# Patient Record
Sex: Female | Born: 1980
Health system: Southern US, Community
[De-identification: ages and names within clinical notes are randomized; demographics above are authoritative.]

## PROBLEM LIST (undated history)

## (undated) DIAGNOSIS — Z973 Presence of spectacles and contact lenses: Secondary | ICD-10-CM

## (undated) DIAGNOSIS — R112 Nausea with vomiting, unspecified: Secondary | ICD-10-CM

## (undated) DIAGNOSIS — J189 Pneumonia, unspecified organism: Secondary | ICD-10-CM

## (undated) DIAGNOSIS — E7800 Pure hypercholesterolemia, unspecified: Secondary | ICD-10-CM

## (undated) DIAGNOSIS — Z800 Family history of malignant neoplasm of digestive organs: Secondary | ICD-10-CM

## (undated) DIAGNOSIS — R002 Palpitations: Secondary | ICD-10-CM

## (undated) DIAGNOSIS — Z9889 Other specified postprocedural states: Secondary | ICD-10-CM

## (undated) DIAGNOSIS — T753XXA Motion sickness, initial encounter: Secondary | ICD-10-CM

## (undated) HISTORY — DX: Pure hypercholesterolemia, unspecified: E78.00

## (undated) HISTORY — DX: Family history of malignant neoplasm of digestive organs: Z80.0

---

## 2014-06-25 ENCOUNTER — Encounter (HOSPITAL_BASED_OUTPATIENT_CLINIC_OR_DEPARTMENT_OTHER): Payer: Self-pay | Admitting: *Deleted

## 2014-06-25 ENCOUNTER — Emergency Department (HOSPITAL_BASED_OUTPATIENT_CLINIC_OR_DEPARTMENT_OTHER)
Admission: EM | Admit: 2014-06-25 | Discharge: 2014-06-25 | Disposition: A | Discharge status: Home / Self Care | Payer: Managed Care, Other (non HMO) | Attending: Emergency Medicine | Admitting: Emergency Medicine

## 2014-06-25 DIAGNOSIS — Z23 Encounter for immunization: Secondary | ICD-10-CM | POA: Insufficient documentation

## 2014-06-25 DIAGNOSIS — R002 Palpitations: Secondary | ICD-10-CM | POA: Insufficient documentation

## 2014-06-25 DIAGNOSIS — W57XXXD Bitten or stung by nonvenomous insect and other nonvenomous arthropods, subsequent encounter: Secondary | ICD-10-CM | POA: Diagnosis not present

## 2014-06-25 DIAGNOSIS — L03114 Cellulitis of left upper limb: Secondary | ICD-10-CM | POA: Diagnosis not present

## 2014-06-25 DIAGNOSIS — S40862D Insect bite (nonvenomous) of left upper arm, subsequent encounter: Secondary | ICD-10-CM | POA: Diagnosis present

## 2014-06-25 MED ORDER — CEFAZOLIN SODIUM 1-5 GM-% IV SOLN
1.0000 g | Freq: Once | INTRAVENOUS | Status: AC
  Administered 2014-06-25: 1 g via INTRAVENOUS
  Filled 2014-06-25: qty 50

## 2014-06-25 MED ORDER — CEPHALEXIN 500 MG PO CAPS: 500.0000 mg | ORAL_CAPSULE | Freq: Four times a day (QID) | ORAL | Status: DC

## 2014-06-25 MED ORDER — TETANUS-DIPHTH-ACELL PERTUSSIS 5-2.5-18.5 LF-MCG/0.5 IM SUSP
0.5000 mL | Freq: Once | INTRAMUSCULAR | Status: AC
  Administered 2014-06-25: 0.5 mL via INTRAMUSCULAR
  Filled 2014-06-25: qty 0.5

## 2014-06-25 NOTE — ED Provider Notes (Signed)
CSN: [id number]     Arrival date & time 06/25/14  1937 History  This chart was scribed for Orlie Dakin, MD by Peyton Bottoms, ED Scribe. This patient was seen in room MHT13/MHT13 and the patient's care was started at 9:03 PM.   Chief Complaint  Patient presents with  . Insect Bite   Patient is a 34 y.o. female presenting with rash. The history is provided by the patient. No language interpreter was used.  Rash Location:  Shoulder/arm Shoulder/arm rash location:  L arm and L upper arm Quality: itchiness and redness   Severity:  Moderate Onset quality:  Gradual Duration:  10 hours Timing:  Constant Progression:  Spreading Chronicity:  New Context: insect bite/sting   Relieved by:  Nothing Ineffective treatments: doxycycline, benadryl, ibuprofen.  HPI Comments: Rhonda Shaw is a 34 y.o. female who presents to the Emergency Department complaining of insect bites to left upper arm that occurred yesterday. Patient suspects a spider bite but is not sure. She states that she felt something bite her, but is unsure what it was. She states that she was seen by Urgent Care earlier today and was given 1 dose of doxycycline, benadryl and ibuprofen. Patient reports increased spreading of rash past the outline made by urgent care earlier today. She reports associated redness and throbbing to upper left arm..  . Sh Patient is unsure of last tetanus shot. Patient does not smoke, drink alcohol or use recreational drugs. Patient was driven by family member to ED today. Denies shortness of breath denies fever or complains of mild general lays.  History reviewed. No pertinent past medical history. History reviewed. No pertinent past surgical history. past medical history negative History reviewed. No pertinent family history. History  Substance Use Topics  . Smoking status: Never Smoker   . Smokeless tobacco: Not on file  . Alcohol Use: No   OB History    No data available     Review of Systems   Constitutional: Negative.        Mild generalized malaise  HENT: Negative.   Respiratory: Negative.   Cardiovascular: Positive for palpitations.       Palpitations lasting a few seconds earlier today, resolved  Gastrointestinal: Negative.   Musculoskeletal: Negative.   Skin: Positive for rash.  Neurological: Negative.   Psychiatric/Behavioral: Negative.   All other systems reviewed and are negative.  Allergies  Sulfa antibiotics  Home Medications   Prior to Admission medications   Not on File   Triage Vitals: BP 160/90 mmHg  Pulse 89  Temp(Src) 97.8 F (36.6 C) (Oral)  Resp 16  Ht 4' 11.5" (1.511 m)  Wt 141 lb (63.957 kg)  BMI 28.01 kg/m2  SpO2 100%  LMP 06/11/2014  Physical Exam  Constitutional: She appears well-developed and well-nourished.  HENT:  Head: Normocephalic and atraumatic.  Eyes: Conjunctivae are normal. Pupils are equal, round, and reactive to light.  Neck: Neck supple. No tracheal deviation present. No thyromegaly present.  Cardiovascular: Normal rate.   Pulmonary/Chest: Effort normal. No respiratory distress.  Abdominal: Soft. Bowel sounds are normal. She exhibits no distension. There is no tenderness.  Musculoskeletal: Normal range of motion. She exhibits no edema or tenderness.  Neurological: She is alert. Coordination normal.  Skin: Skin is warm and dry. Rash noted.  3 dime-sized papular lesions to left upper medial arm with surrounding erythema, and a single dime-sized papular lesion at distal volar forearm with surrounding erythema. There is mildly tender. No fluctuance. Radial pulse 2+. All  digits with full range of motion good capillary refill and extremity has full range of motion. No axillary nodes.  Psychiatric: She has a normal mood and affect.  Nursing note and vitals reviewed.  ED Course  Procedures (including critical care time)  DIAGNOSTIC STUDIES: Oxygen Saturation is 100% on RA, normal by my interpretation.    COORDINATION OF  CARE: 9:11 PM- Discussed plans to give patient IV fluids and Ancef. Pt advised of plan for treatment and pt agrees.  Labs Review Labs Reviewed - No data to display  Imaging Review No results found.   EKG Interpretation None     10:20 PM resting comfortably after treatment with infusion of intravenous Ancef. MDM  Plan prescription Keflex. Continue doxycycline. Return for recheck or see PMD in 2 or 3 days or urgent care center. Sooner if redness continues to spread. Diagnosis Final diagnoses:  None   Dx: cellulitis  ofleft arm   I personally performed the services described in this documentation, which was scribed in my presence. The recorded information has been reviewed and is accurate.  Orlie Dakin, MD 06/25/14 2227

## 2014-06-25 NOTE — ED Notes (Signed)
Pt c/o insect bite to left upper arm arm, seen by UC given ABX , here tonight for increased pain/swelling and redness

## 2014-06-25 NOTE — Discharge Instructions (Signed)
Cellulitis  Take the medicine prescribed tonight in addition to the doxycycline that you were prescribed earlier.you can get the prescription filled and start taking the new antibiotic tomorrow. Take Tylenol or Advil as directed for pain. Return in 2 or 3 days or see your doctor or an urgent care center to get your arm rechecked. Return sooner if redness continues to spread, if you develop fever, vomiting, or feel worse for any reason. Cellulitis is an infection of the skin and the tissue beneath it. The infected area is usually red and tender. Cellulitis occurs most often in the arms and lower legs.  CAUSES  Cellulitis is caused by bacteria that enter the skin through cracks or cuts in the skin. The most common types of bacteria that cause cellulitis are staphylococci and streptococci. SIGNS AND SYMPTOMS   Redness and warmth.  Swelling.  Tenderness or pain.  Fever. DIAGNOSIS  Your health care provider can usually determine what is wrong based on a physical exam. Blood tests may also be done. TREATMENT  Treatment usually involves taking an antibiotic medicine. HOME CARE INSTRUCTIONS   Take your antibiotic medicine as directed by your health care provider. Finish the antibiotic even if you start to feel better.  Keep the infected arm or leg elevated to reduce swelling.  Apply a warm cloth to the affected area up to 4 times per day to relieve pain.  Take medicines only as directed by your health care provider.  Keep all follow-up visits as directed by your health care provider. SEEK MEDICAL CARE IF:   You notice red streaks coming from the infected area.  Your red area gets larger or turns dark in color.  Your bone or joint underneath the infected area becomes painful after the skin has healed.  Your infection returns in the same area or another area.  You notice a swollen bump in the infected area.  You develop new symptoms.  You have a fever. SEEK IMMEDIATE MEDICAL CARE  IF:   You feel very sleepy.  You develop vomiting or diarrhea.  You have a general ill feeling (malaise) with muscle aches and pains. MAKE SURE YOU:   Understand these instructions.  Will watch your condition.  Will get help right away if you are not doing well or get worse. Document Released: 03/01/2005 Document Revised: 10/06/2013 Document Reviewed: 08/07/2011 [facility] Patient Information 2015 [place], [place]. This information is not intended to replace advice given to you by your health care provider. Make sure you discuss any questions you have with your health care provider.

## 2018-02-19 DIAGNOSIS — H5213 Myopia, bilateral: Secondary | ICD-10-CM | POA: Diagnosis not present

## 2018-02-19 DIAGNOSIS — Z0100 Encounter for examination of eyes and vision without abnormal findings: Secondary | ICD-10-CM | POA: Diagnosis not present

## 2018-03-01 ENCOUNTER — Ambulatory Visit: Payer: Self-pay | Admitting: Family Medicine

## 2019-04-10 DIAGNOSIS — Z20828 Contact with and (suspected) exposure to other viral communicable diseases: Secondary | ICD-10-CM | POA: Diagnosis not present

## 2019-11-04 HISTORY — PX: APPENDECTOMY: SHX54

## 2019-11-10 DIAGNOSIS — K353 Acute appendicitis with localized peritonitis, without perforation or gangrene: Secondary | ICD-10-CM | POA: Diagnosis not present

## 2019-11-10 DIAGNOSIS — Z20822 Contact with and (suspected) exposure to covid-19: Secondary | ICD-10-CM | POA: Diagnosis not present

## 2019-11-10 DIAGNOSIS — K910 Vomiting following gastrointestinal surgery: Secondary | ICD-10-CM | POA: Diagnosis not present

## 2019-11-11 DIAGNOSIS — R110 Nausea: Secondary | ICD-10-CM | POA: Diagnosis not present

## 2019-11-11 DIAGNOSIS — Z9889 Other specified postprocedural states: Secondary | ICD-10-CM | POA: Insufficient documentation

## 2019-11-11 DIAGNOSIS — Z20822 Contact with and (suspected) exposure to covid-19: Secondary | ICD-10-CM | POA: Diagnosis not present

## 2019-11-11 DIAGNOSIS — K353 Acute appendicitis with localized peritonitis, without perforation or gangrene: Secondary | ICD-10-CM | POA: Diagnosis not present

## 2019-11-11 DIAGNOSIS — R935 Abnormal findings on diagnostic imaging of other abdominal regions, including retroperitoneum: Secondary | ICD-10-CM | POA: Diagnosis not present

## 2019-11-11 DIAGNOSIS — R112 Nausea with vomiting, unspecified: Secondary | ICD-10-CM | POA: Insufficient documentation

## 2019-11-11 DIAGNOSIS — R1031 Right lower quadrant pain: Secondary | ICD-10-CM | POA: Diagnosis not present

## 2019-11-11 DIAGNOSIS — K910 Vomiting following gastrointestinal surgery: Secondary | ICD-10-CM | POA: Diagnosis not present

## 2019-11-11 DIAGNOSIS — K37 Unspecified appendicitis: Secondary | ICD-10-CM | POA: Insufficient documentation

## 2019-11-13 DIAGNOSIS — K3589 Other acute appendicitis without perforation or gangrene: Secondary | ICD-10-CM | POA: Diagnosis not present

## 2019-12-11 DIAGNOSIS — K3589 Other acute appendicitis without perforation or gangrene: Secondary | ICD-10-CM | POA: Diagnosis not present

## 2020-02-04 DIAGNOSIS — Z03818 Encounter for observation for suspected exposure to other biological agents ruled out: Secondary | ICD-10-CM | POA: Diagnosis not present

## 2020-02-16 ENCOUNTER — Ambulatory Visit: Payer: Self-pay | Admitting: Obstetrics and Gynecology

## 2020-03-22 NOTE — Progress Notes (Signed)
PCP:  Patient, No Pcp Per   Chief Complaint  Patient presents with  . Gynecologic Exam  . Injections    flu     HPI:      [name] is a 39 y.o. No obstetric history on file. whose LMP was Patient's last menstrual period was 03/04/2020 (exact date)., presents today for her NP > 3 yrs annual examination.  Her menses are regular every 28-30 days, lasting 4-6 days.  Dysmenorrhea mild, occurring first 1-2 days of flow. She does not have intermenstrual bleeding.  Sex activity: single partner, contraception - none. Last Pap: 04/28/14  Results were: no abnormalities /neg HPV DNA  Hx of STDs: none  Last mammogram: never There is a FH of breast cancer in her pat grt aunt and aunt. Her pat aunt also had colon cancer and was recently found to have Lynch syndrome. Pt's sister also has Lynch syndrome. Pt hasn't been tested yet. Unsure which mutation sister has. There is no FH of ovarian cancer. The patient does do self-breast exams.  Tobacco use: The patient denies current or previous tobacco use. Alcohol use: social drinker No drug use.  Exercise: not active  She does get adequate calcium and Vitamin D in her diet. No recent labs. Hx of high cholesterol, on meds in past.   Past Medical History:  Diagnosis Date  . Family history of Lynch syndrome   . Hypercholesterolemia     History reviewed. No pertinent surgical history.  Family History  Problem Relation Age of Onset  . Breast cancer Paternal Aunt 65       triple neg; Lynch syndrome  . Colon cancer Paternal Aunt 49  . Prostate cancer Paternal Great-grandfather        72s  . Other Sister 76       Lynch Syndrome Positive  . Breast cancer Paternal Aunt        not sure of age  . Prostate cancer Paternal Grandfather 70    Social History   Socioeconomic History  . Marital status: Married    Spouse name: Not on file  . Number of children: Not on file  . Years of education: Not on file  . Highest education level:  Not on file  Occupational History  . Not on file  Tobacco Use  . Smoking status: Never Smoker  . Smokeless tobacco: Never Used  Vaping Use  . Vaping Use: Never used  Substance and Sexual Activity  . Alcohol use: No  . Drug use: No  . Sexual activity: Yes    Birth control/protection: None  Other Topics Concern  . Not on file  Social History Narrative  . Not on file   Social Determinants of Health   Financial Resource Strain:   . Difficulty of Paying Living Expenses: Not on file  Food Insecurity:   . Worried About Charity fundraiser in the Last Year: Not on file  . Ran Out of Food in the Last Year: Not on file  Transportation Needs:   . Lack of Transportation (Medical): Not on file  . Lack of Transportation (Non-Medical): Not on file  Physical Activity:   . Days of Exercise per Week: Not on file  . Minutes of Exercise per Session: Not on file  Stress:   . Feeling of Stress : Not on file  Social Connections:   . Frequency of Communication with Friends and Family: Not on file  . Frequency of Social Gatherings with Friends and  Family: Not on file  . Attends Religious Services: Not on file  . Active Member of Clubs or Organizations: Not on file  . Attends Archivist Meetings: Not on file  . Marital Status: Not on file  Intimate Partner Violence:   . Fear of Current or Ex-Partner: Not on file  . Emotionally Abused: Not on file  . Physically Abused: Not on file  . Sexually Abused: Not on file    No current outpatient medications on file.     ROS:  Review of Systems  Constitutional: Negative for fatigue, fever and unexpected weight change.  Respiratory: Negative for cough, shortness of breath and wheezing.   Cardiovascular: Negative for chest pain, palpitations and leg swelling.  Gastrointestinal: Negative for blood in stool, constipation, diarrhea, nausea and vomiting.  Endocrine: Negative for cold intolerance, heat intolerance and polyuria.   Genitourinary: Negative for dyspareunia, dysuria, flank pain, frequency, genital sores, hematuria, menstrual problem, pelvic pain, urgency, vaginal bleeding, vaginal discharge and vaginal pain.  Musculoskeletal: Negative for back pain, joint swelling and myalgias.  Skin: Negative for rash.  Neurological: Negative for dizziness, syncope, light-headedness, numbness and headaches.  Hematological: Negative for adenopathy.  Psychiatric/Behavioral: Negative for agitation, confusion, sleep disturbance and suicidal ideas. The patient is not nervous/anxious.   BREAST: No symptoms   Objective: BP 120/80   Ht 5' (1.524 m)   Wt 151 lb (68.5 kg)   LMP 03/04/2020 (Exact Date)   BMI 29.49 kg/m    Physical Exam Constitutional:      Appearance: She is well-developed.  Genitourinary:     Vulva, vagina, cervix, uterus, right adnexa and left adnexa normal.     No vulval lesion or tenderness noted.     No vaginal discharge, erythema or tenderness.     No cervical polyp.     Uterus is not enlarged or tender.     No right or left adnexal mass present.     Right adnexa not tender.     Left adnexa not tender.  Neck:     Thyroid: No thyromegaly.  Cardiovascular:     Rate and Rhythm: Normal rate and regular rhythm.     Heart sounds: Normal heart sounds. No murmur heard.   Pulmonary:     Effort: Pulmonary effort is normal.     Breath sounds: Normal breath sounds.  Chest:     Breasts:        Right: No mass, nipple discharge, skin change or tenderness.        Left: No mass, nipple discharge, skin change or tenderness.  Abdominal:     Palpations: Abdomen is soft.     Tenderness: There is no abdominal tenderness. There is no guarding.  Musculoskeletal:        General: Normal range of motion.     Cervical back: Normal range of motion.  Neurological:     General: No focal deficit present.     Mental Status: She is alert and oriented to person, place, and time.     Cranial Nerves: No cranial nerve  deficit.  Skin:    General: Skin is warm and dry.  Psychiatric:        Mood and Affect: Mood normal.        Behavior: Behavior normal.        Thought Content: Thought content normal.        Judgment: Judgment normal.  Vitals reviewed.     Assessment/Plan: Encounter for annual routine gynecological examination  Cervical cancer screening - Plan: Cytology - PAP  Screening for HPV (human papillomavirus) - Plan: Cytology - PAP  Encounter for screening mammogram for malignant neoplasm of breast - Plan: MM 3D SCREEN BREAST BILATERAL; pt to sched mammo  Family history of Lynch syndrome - Plan: Integrated BRACAnalysis; MyRisk testing discussed and done today. Will call with results.   Blood tests for routine general physical examination - Plan: Comprehensive metabolic panel, Lipid panel  Screening cholesterol level - Plan: Lipid panel; hx of hyperlipidemia on meds in past  Need for immunization against influenza - Plan: Flu Vaccine QUAD 36+ mos IM      GYN counsel adequate intake of calcium and vitamin D, diet and exercise     F/U  Return in about 1 year (around 03/23/2021).  Shaiann Mcmanamon B. Keyon Liller, PA-C 03/23/2020 11:57 AM

## 2020-03-22 NOTE — Patient Instructions (Addendum)
I value your feedback and entrusting us with your care. If you get a [facility] patient survey, I would appreciate you taking the time to let us know about your experience today. Thank you!  As of May 15, 2019, your lab results will be released to your MyChart immediately, before I even have a chance to see them. Please give me time to review them and contact you if there are any abnormalities. Thank you for your patience.   [facility]: [phone]  [facility]: [phone]  

## 2020-03-23 ENCOUNTER — Encounter: Payer: Self-pay | Admitting: Obstetrics and Gynecology

## 2020-03-23 ENCOUNTER — Other Ambulatory Visit (HOSPITAL_COMMUNITY)
Admission: RE | Admit: 2020-03-23 | Discharge: 2020-03-23 | Disposition: A | Discharge status: Home / Self Care | Payer: No Typology Code available for payment source | Source: Ambulatory Visit | Attending: Obstetrics and Gynecology | Admitting: Obstetrics and Gynecology

## 2020-03-23 ENCOUNTER — Other Ambulatory Visit: Payer: Self-pay

## 2020-03-23 ENCOUNTER — Ambulatory Visit (INDEPENDENT_AMBULATORY_CARE_PROVIDER_SITE_OTHER): Payer: No Typology Code available for payment source | Admitting: Obstetrics and Gynecology

## 2020-03-23 VITALS — BP 120/80 | Ht 60.0 in | Wt 151.0 lb

## 2020-03-23 DIAGNOSIS — Z1231 Encounter for screening mammogram for malignant neoplasm of breast: Secondary | ICD-10-CM

## 2020-03-23 DIAGNOSIS — Z124 Encounter for screening for malignant neoplasm of cervix: Secondary | ICD-10-CM | POA: Insufficient documentation

## 2020-03-23 DIAGNOSIS — Z13220 Encounter for screening for lipoid disorders: Secondary | ICD-10-CM

## 2020-03-23 DIAGNOSIS — Z23 Encounter for immunization: Secondary | ICD-10-CM | POA: Diagnosis not present

## 2020-03-23 DIAGNOSIS — Z800 Family history of malignant neoplasm of digestive organs: Secondary | ICD-10-CM

## 2020-03-23 DIAGNOSIS — Z1151 Encounter for screening for human papillomavirus (HPV): Secondary | ICD-10-CM

## 2020-03-23 DIAGNOSIS — Z01419 Encounter for gynecological examination (general) (routine) without abnormal findings: Secondary | ICD-10-CM

## 2020-03-23 DIAGNOSIS — Z0000 Encounter for general adult medical examination without abnormal findings: Secondary | ICD-10-CM | POA: Diagnosis not present

## 2020-03-23 DIAGNOSIS — R69 Illness, unspecified: Secondary | ICD-10-CM | POA: Diagnosis not present

## 2020-03-24 LAB — COMPREHENSIVE METABOLIC PANEL
ALT: 18 IU/L (ref 0–32)
AST: 21 IU/L (ref 0–40)
Albumin/Globulin Ratio: 1.7 (ref 1.2–2.2)
Albumin: 4.8 g/dL (ref 3.8–4.8)
Alkaline Phosphatase: 84 IU/L (ref 44–121)
BUN/Creatinine Ratio: 8 — ABNORMAL LOW (ref 9–23)
BUN: 6 mg/dL (ref 6–24)
Bilirubin Total: <0.2 mg/dL (ref 0.0–1.2)
CO2: 23 mmol/L (ref 20–29)
Calcium: 9.6 mg/dL (ref 8.7–10.2)
Chloride: 104 mmol/L (ref 96–106)
Creatinine, Ser: 0.72 mg/dL (ref 0.57–1.00)
GFR calc Af Amer: 121 mL/min/{1.73_m2} (ref >59)
GFR calc non Af Amer: 105 mL/min/{1.73_m2} (ref >59)
Globulin, Total: 2.8 g/dL (ref 1.5–4.5)
Glucose: 83 mg/dL (ref 65–99)
Potassium: 4.3 mmol/L (ref 3.5–5.2)
Sodium: 141 mmol/L (ref 134–144)
Total Protein: 7.6 g/dL (ref 6.0–8.5)

## 2020-03-24 LAB — LIPID PANEL
Chol/HDL Ratio: 4.9 ratio — ABNORMAL HIGH (ref 0.0–4.4)
Cholesterol, Total: 241 mg/dL — ABNORMAL HIGH (ref 100–199)
HDL: 49 mg/dL (ref >39)
LDL Chol Calc (NIH): 164 mg/dL — ABNORMAL HIGH (ref 0–99)
Triglycerides: 155 mg/dL — ABNORMAL HIGH (ref 0–149)
VLDL Cholesterol Cal: 28 mg/dL (ref 5–40)

## 2020-03-25 ENCOUNTER — Encounter: Payer: Self-pay | Admitting: Obstetrics and Gynecology

## 2020-03-26 LAB — CYTOLOGY - PAP
Comment: NEGATIVE
Diagnosis: NEGATIVE
High risk HPV: NEGATIVE

## 2020-04-05 DIAGNOSIS — Z1509 Genetic susceptibility to other malignant neoplasm: Secondary | ICD-10-CM

## 2020-04-05 DIAGNOSIS — Z803 Family history of malignant neoplasm of breast: Secondary | ICD-10-CM

## 2020-04-05 HISTORY — DX: Genetic susceptibility to other malignant neoplasm: Z15.09

## 2020-04-05 HISTORY — DX: Family history of malignant neoplasm of breast: Z80.3

## 2020-04-20 ENCOUNTER — Encounter: Payer: Self-pay | Admitting: Obstetrics and Gynecology

## 2020-05-06 DIAGNOSIS — Z03818 Encounter for observation for suspected exposure to other biological agents ruled out: Secondary | ICD-10-CM | POA: Diagnosis not present

## 2020-05-06 DIAGNOSIS — U071 COVID-19: Secondary | ICD-10-CM

## 2020-05-06 DIAGNOSIS — Z20822 Contact with and (suspected) exposure to covid-19: Secondary | ICD-10-CM | POA: Diagnosis not present

## 2020-05-06 HISTORY — DX: COVID-19: U07.1

## 2020-05-11 ENCOUNTER — Telehealth: Payer: Self-pay | Admitting: Obstetrics and Gynecology

## 2020-05-11 DIAGNOSIS — Z1509 Genetic susceptibility to other malignant neoplasm: Secondary | ICD-10-CM

## 2020-05-11 DIAGNOSIS — Z1211 Encounter for screening for malignant neoplasm of colon: Secondary | ICD-10-CM

## 2020-05-11 NOTE — Telephone Encounter (Signed)
Pt aware of positive PMS2 mutation/Lynch syndrome. Sister has same mutation.   Due to increased risk of   Colon/small bowel and gastric cancer--ref to GI for EGD/colonoscopy. Ovarian cancer--recommended BSO vs Gyn u/s and ca-125. Done childbearing. Uterine cancer--recommended hyst vs EMB and GYN u/s.  No FH pancreatic cancer.  Pt to discuss options with husband and f/u with me for either GYN u/s, ca-125 and EMB or with MD for hyst/BSO consultation.   Pt with FH breast cancer, IBIS=18.2%/riskscore=16.5%. No addl screening indicated. Recommended monthly SBE and yearly mammo. Order placed, pt to sched.  Patient understands these results only apply to her and her children, and this is not indicative of genetic testing results of her other family members. It is recommended that her other family members have genetic testing done.  Hard copy mailed to pt. F/u prn.

## 2020-05-20 ENCOUNTER — Encounter: Payer: Self-pay | Admitting: *Deleted

## 2020-08-05 ENCOUNTER — Encounter: Payer: Self-pay | Admitting: Obstetrics and Gynecology

## 2020-08-11 ENCOUNTER — Encounter: Payer: Self-pay | Admitting: Gastroenterology

## 2020-08-11 ENCOUNTER — Other Ambulatory Visit: Payer: Self-pay

## 2020-08-11 ENCOUNTER — Telehealth (INDEPENDENT_AMBULATORY_CARE_PROVIDER_SITE_OTHER): Payer: Self-pay | Admitting: Gastroenterology

## 2020-08-11 DIAGNOSIS — Z1509 Genetic susceptibility to other malignant neoplasm: Secondary | ICD-10-CM

## 2020-08-11 DIAGNOSIS — Z1211 Encounter for screening for malignant neoplasm of colon: Secondary | ICD-10-CM

## 2020-08-11 MED ORDER — PEG 3350-KCL-NA BICARB-NACL 420 G PO SOLR: 4000.0000 mL | Freq: Once | ORAL | 0 refills | Status: AC

## 2020-08-11 NOTE — Progress Notes (Signed)
Gastroenterology Pre-Procedure Review  Request Date: Monday 08/16/20 Requesting Physician: Dr. Allen Norris  PATIENT REVIEW QUESTIONS: The patient responded to the following health history questions as indicated:    1. Are you having any GI issues? no 2. Do you have a personal history of Polyps? no 3. Do you have a family history of Colon Cancer or Polyps? No family history of colon cancer however family history of Copywriter, advertising and patient 4. Diabetes Mellitus? no 5. Joint replacements in the past 12 months?no 6. Major health problems in the past 3 months?no Appendectomy 11/2019 7. Any artificial heart valves, MVP, or defibrillator?no    MEDICATIONS & ALLERGIES:    Patient reports the following regarding taking any anticoagulation/antiplatelet therapy:   Plavix, Coumadin, Eliquis, Xarelto, Lovenox, Pradaxa, Brilinta, or Effient? no Aspirin? no  Patient confirms/reports the following medications:  Current Outpatient Medications  Medication Sig Dispense Refill  . polyethylene glycol-electrolytes (NULYTELY) 420 g solution Take 4,000 mLs by mouth once for 1 dose. Fill container to the fill line with clear liquid.  Mix well.  Drink 8 oz every 30 minutes until entire contents have been completed.  Do not eat or drink anything 4 hours prior to procedure. 4000 mL 0  . Multiple Vitamin (MULTIVITAMINS PO) See admin instructions.     No current facility-administered medications for this visit.    Patient confirms/reports the following allergies:  Allergies  Allergen Reactions  . Sulfa Antibiotics     Orders Placed This Encounter  Procedures  . Procedural/ Surgical Case Request: COLONOSCOPY WITH PROPOFOL    Standing Status:   Standing    Number of Occurrences:   1    Order Specific Question:   Pre-op diagnosis    Answer:   lynch sydrome, screening colonoscopy    Order Specific Question:   CPT Code    Answer:   17001    AUTHORIZATION INFORMATION Primary Insurance: 1D#: Group  #:  Secondary Insurance: 1D#: Group #:  SCHEDULE INFORMATION: Date: 08/16/20 Time: Location:[facility]

## 2020-08-12 ENCOUNTER — Other Ambulatory Visit: Payer: Self-pay

## 2020-08-12 ENCOUNTER — Ambulatory Visit
Admission: RE | Admit: 2020-08-12 | Discharge: 2020-08-12 | Disposition: A | Discharge status: Home / Self Care | Payer: No Typology Code available for payment source | Source: Ambulatory Visit | Attending: Obstetrics and Gynecology | Admitting: Obstetrics and Gynecology

## 2020-08-12 ENCOUNTER — Other Ambulatory Visit
Admission: RE | Admit: 2020-08-12 | Discharge: 2020-08-12 | Disposition: A | Discharge status: Home / Self Care | Payer: No Typology Code available for payment source | Source: Ambulatory Visit | Attending: Gastroenterology | Admitting: Gastroenterology

## 2020-08-12 DIAGNOSIS — Z01812 Encounter for preprocedural laboratory examination: Secondary | ICD-10-CM | POA: Insufficient documentation

## 2020-08-12 DIAGNOSIS — Z20822 Contact with and (suspected) exposure to covid-19: Secondary | ICD-10-CM | POA: Insufficient documentation

## 2020-08-12 DIAGNOSIS — Z1231 Encounter for screening mammogram for malignant neoplasm of breast: Secondary | ICD-10-CM | POA: Insufficient documentation

## 2020-08-12 LAB — SARS CORONAVIRUS 2 (TAT 6-24 HRS): SARS Coronavirus 2: NEGATIVE

## 2020-08-12 NOTE — Progress Notes (Signed)
I connected with Rhonda Shaw on 08/13/20 at  8:30 AM EST by telephone and verified that I am speaking with the correct person using two identifiers.   I discussed the limitations, risks, security and privacy concerns of performing an evaluation and management service by telephone and the availability of in person appointments. I also discussed with the patient that there may be a patient responsible charge related to this service. The patient expressed understanding and agreed to proceed.  The patient was at home I spoke with the patient from my workstation phone The names of people involved in this encounter were: Rhonda Shaw , and Rhonda Shaw   Gynecology Visit Lynch Syndrome   Chief Complaint: No chief complaint on file.   History of Present Illness:    Paitient is a 40 y.o. 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  She presents today to discuss management options from GYN standpoint.    LMP: Patient's last menstrual period was 07/28/2020 (approximate). Regular monthly, 5 days, light then gets two heavier days.    Review of Systems: Review of Systems  Constitutional: Negative.   Gastrointestinal: Negative.   Genitourinary: Negative.     Past Medical History:  Patient Active Problem List   Diagnosis Date Noted  . PMS2-related Lynch syndrome (HNPCC4) 05/11/2020  . Family history of Lynch syndrome 03/23/2020  . Appendicitis 11/11/2019  . PONV (postoperative nausea and vomiting) 11/11/2019    Past Surgical History:  Past Surgical History:  Procedure Laterality Date  . APPENDECTOMY  11/2019    Obstetric History: [id number]  Family History:  Family History  Problem Relation Age of Onset  . Breast cancer Paternal Aunt 8       triple neg; Lynch syndrome  . Colon cancer Paternal Aunt 30  . Prostate cancer Paternal Great-grandfather        30s  . Other Sister 75       Lynch Syndrome Positive  .  Breast cancer Paternal Aunt        not sure of age  . Prostate cancer Paternal Grandfather 38    Social History:  Social History   Socioeconomic History  . Marital status: Married    Spouse name: Not on file  . Number of children: Not on file  . Years of education: Not on file  . Highest education level: Not on file  Occupational History  . Not on file  Tobacco Use  . Smoking status: Never Smoker  . Smokeless tobacco: Never Used  Vaping Use  . Vaping Use: Never used  Substance and Sexual Activity  . Alcohol use: No    Comment: rare  . Drug use: No  . Sexual activity: Yes    Birth control/protection: None  Other Topics Concern  . Not on file  Social History Narrative  . Not on file   Social Determinants of Health   Financial Resource Strain: Not on file  Food Insecurity: Not on file  Transportation Needs: Not on file  Physical Activity: Not on file  Stress: Not on file  Social Connections: Not on file  Intimate Partner Violence: Not on file    Allergies:  Allergies  Allergen Reactions  . Sulfa Antibiotics Hives    Medications: Prior to Admission medications   Medication Sig Start Date End Date Taking? Authorizing Provider  Multiple Vitamin (MULTIVITAMINS PO) See admin instructions.    [provider]    Physical Exam Last menstrual period 07/28/2020.  Patient's  last menstrual period was 07/28/2020 (approximate).  No physical exam as this was a remote telephone visit to promote social distancing during the current COVID-19 Pandemic   Assessment: 40 y.o. [id number] with lynch syndrome, PMS2 mutation  Plan: Problem List Items Addressed This Visit      Other   PMS2-related Lynch syndrome (HNPCC4) - Primary      1) PMS2 mutation summary of surveillance prevention strategies NCCN guidliness Lynch Syndrome Ver 1.2021, 10/14/2019  Endometrial cancer Estimate age presentation 49-50, cumulative lifetime risk through age 11 13-26% compared  cumulative lifetime risk general population 3.1% Total hysterectomy has not been shown to decrease endometrial cancer mortality, routine endometrial cancer screening has not been shown of benefit.  Hysterectomy may reduce endometrial cancer incidence and may be considered  Ovarian Cancer Estimated age presenbtation 51-50, cumulative risk lifetime risk through age 31 1.3-3% compared cumulative lifetime risk general population 1.3% Studies have failed to show an appreciable increase in ovarian cancer risk over the baseline population.  Decision for risk reducing BSO individualized.  For women electing for ovarian conservation to screening recommendations     - Endometrial biopsy  - pap smear up to date - No prior ultrasound but CT scan 11/2019 [facility] for acute appendicitis revealed normal uterus and ovaries.  2) Telephone Time:  22:39mn  3)  Return if symptoms worsen or fail to improve.   AMalachy Mood MD, [facility]ardon[facility], [facility] 08/13/2020, 8:51 AM

## 2020-08-12 NOTE — Discharge Instructions (Signed)

## 2020-08-13 ENCOUNTER — Ambulatory Visit (INDEPENDENT_AMBULATORY_CARE_PROVIDER_SITE_OTHER): Payer: No Typology Code available for payment source | Admitting: Obstetrics and Gynecology

## 2020-08-13 DIAGNOSIS — Z1509 Genetic susceptibility to other malignant neoplasm: Secondary | ICD-10-CM

## 2020-08-13 DIAGNOSIS — Z7189 Other specified counseling: Secondary | ICD-10-CM | POA: Diagnosis not present

## 2020-08-16 ENCOUNTER — Other Ambulatory Visit: Payer: Self-pay | Admitting: Obstetrics and Gynecology

## 2020-08-16 ENCOUNTER — Ambulatory Visit: Payer: No Typology Code available for payment source | Admitting: Anesthesiology

## 2020-08-16 ENCOUNTER — Encounter: Payer: Self-pay | Admitting: Gastroenterology

## 2020-08-16 ENCOUNTER — Encounter
Admission: RE | Disposition: A | Discharge status: Home / Self Care | Payer: Self-pay | Source: Home / Self Care | Attending: Gastroenterology

## 2020-08-16 ENCOUNTER — Ambulatory Visit
Admission: RE | Admit: 2020-08-16 | Discharge: 2020-08-16 | Disposition: A | Discharge status: Home / Self Care | Payer: No Typology Code available for payment source | Attending: Gastroenterology | Admitting: Gastroenterology

## 2020-08-16 ENCOUNTER — Other Ambulatory Visit: Payer: Self-pay

## 2020-08-16 DIAGNOSIS — Z800 Family history of malignant neoplasm of digestive organs: Secondary | ICD-10-CM | POA: Diagnosis not present

## 2020-08-16 DIAGNOSIS — Z1589 Genetic susceptibility to other disease: Secondary | ICD-10-CM | POA: Diagnosis not present

## 2020-08-16 DIAGNOSIS — Z8616 Personal history of COVID-19: Secondary | ICD-10-CM | POA: Diagnosis not present

## 2020-08-16 DIAGNOSIS — Z803 Family history of malignant neoplasm of breast: Secondary | ICD-10-CM | POA: Diagnosis not present

## 2020-08-16 DIAGNOSIS — Z8042 Family history of malignant neoplasm of prostate: Secondary | ICD-10-CM | POA: Insufficient documentation

## 2020-08-16 DIAGNOSIS — Z882 Allergy status to sulfonamides status: Secondary | ICD-10-CM | POA: Insufficient documentation

## 2020-08-16 DIAGNOSIS — Z1211 Encounter for screening for malignant neoplasm of colon: Secondary | ICD-10-CM | POA: Diagnosis not present

## 2020-08-16 DIAGNOSIS — Z1509 Genetic susceptibility to other malignant neoplasm: Secondary | ICD-10-CM | POA: Diagnosis not present

## 2020-08-16 DIAGNOSIS — R928 Other abnormal and inconclusive findings on diagnostic imaging of breast: Secondary | ICD-10-CM

## 2020-08-16 DIAGNOSIS — N6310 Unspecified lump in the right breast, unspecified quadrant: Secondary | ICD-10-CM

## 2020-08-16 HISTORY — DX: Nausea with vomiting, unspecified: R11.2

## 2020-08-16 HISTORY — DX: Motion sickness, initial encounter: T75.3XXA

## 2020-08-16 HISTORY — PX: ESOPHAGOGASTRODUODENOSCOPY: SHX5428

## 2020-08-16 HISTORY — DX: Other specified postprocedural states: Z98.890

## 2020-08-16 HISTORY — PX: COLONOSCOPY WITH PROPOFOL: SHX5780

## 2020-08-16 HISTORY — DX: Presence of spectacles and contact lenses: Z97.3

## 2020-08-16 HISTORY — PX: BIOPSY: SHX5522

## 2020-08-16 LAB — POCT PREGNANCY, URINE: Preg Test, Ur: NEGATIVE

## 2020-08-16 SURGERY — COLONOSCOPY WITH PROPOFOL
Anesthesia: General | Site: Throat

## 2020-08-16 MED ORDER — LIDOCAINE HCL (CARDIAC) PF 100 MG/5ML IV SOSY
PREFILLED_SYRINGE | INTRAVENOUS | Status: DC | PRN
  Administered 2020-08-16: 60 mg via INTRAVENOUS

## 2020-08-16 MED ORDER — ACETAMINOPHEN 325 MG PO TABS: 325.0000 mg | ORAL_TABLET | ORAL | Status: DC | PRN

## 2020-08-16 MED ORDER — GLYCOPYRROLATE 0.2 MG/ML IJ SOLN
INTRAMUSCULAR | Status: DC | PRN
  Administered 2020-08-16: .1 mg via INTRAVENOUS

## 2020-08-16 MED ORDER — ACETAMINOPHEN 160 MG/5ML PO SOLN: 325.0000 mg | ORAL | Status: DC | PRN

## 2020-08-16 MED ORDER — LACTATED RINGERS IV SOLN: INTRAVENOUS | Status: DC | PRN

## 2020-08-16 MED ORDER — LACTATED RINGERS IV SOLN: INTRAVENOUS | Status: DC

## 2020-08-16 MED ORDER — STERILE WATER FOR IRRIGATION IR SOLN
Status: DC | PRN
  Administered 2020-08-16: 15 mL
  Administered 2020-08-16: 100 mL

## 2020-08-16 MED ORDER — ONDANSETRON HCL 4 MG/2ML IJ SOLN: 4.0000 mg | Freq: Once | INTRAMUSCULAR | Status: DC | PRN

## 2020-08-16 MED ORDER — PROPOFOL 10 MG/ML IV BOLUS
INTRAVENOUS | Status: DC | PRN
  Administered 2020-08-16 (×2): 30 mg via INTRAVENOUS
  Administered 2020-08-16: 50 mg via INTRAVENOUS

## 2020-08-16 SURGICAL SUPPLY — 22 items
CLIP HMST 235XBRD CATH ROT (MISCELLANEOUS) IMPLANT
CLIP RESOLUTION 360 11X235 (MISCELLANEOUS)
ELECT REM PT RETURN 9FT ADLT (ELECTROSURGICAL)
ELECTRODE REM PT RTRN 9FT ADLT (ELECTROSURGICAL) IMPLANT
FORCEPS BIOP RAD 4 LRG CAP 4 (CUTTING FORCEPS) ×4 IMPLANT
GOWN CVR UNV OPN BCK APRN NK (MISCELLANEOUS) ×6 IMPLANT
GOWN ISOL THUMB LOOP REG UNIV (MISCELLANEOUS) ×8
INJECTOR VARIJECT VIN23 (MISCELLANEOUS) IMPLANT
KIT DEFENDO VALVE AND CONN (KITS) IMPLANT
KIT PRC NS LF DISP ENDO (KITS) ×3 IMPLANT
KIT PROCEDURE OLYMPUS (KITS) ×4
MANIFOLD NEPTUNE II (INSTRUMENTS) ×4 IMPLANT
MARKER SPOT ENDO TATTOO 5ML (MISCELLANEOUS) IMPLANT
PROBE APC STR FIRE (PROBE) IMPLANT
RETRIEVER NET ROTH 2.5X230 LF (MISCELLANEOUS) IMPLANT
SNARE COLD EXACTO (MISCELLANEOUS) IMPLANT
SNARE SHORT THROW 13M SML OVAL (MISCELLANEOUS) IMPLANT
SNARE SNG USE RND 15MM (INSTRUMENTS) IMPLANT
SPOT EX ENDOSCOPIC TATTOO (MISCELLANEOUS)
TRAP ETRAP POLY (MISCELLANEOUS) IMPLANT
VARIJECT INJECTOR VIN23 (MISCELLANEOUS)
WATER STERILE IRR 250ML POUR (IV SOLUTION) ×4 IMPLANT

## 2020-08-16 NOTE — Anesthesia Procedure Notes (Signed)
Date/Time: 08/16/2020 10:58 AM Performed by: Dionne Bucy, CRNA Pre-anesthesia Checklist: Patient identified, Emergency Drugs available, Suction available, Patient being monitored and Timeout performed Oxygen Delivery Method: Nasal cannula Placement Confirmation: positive ETCO2

## 2020-08-16 NOTE — Op Note (Signed)
[facility] Gastroenterology Patient Name: Rhonda Shaw Procedure Date: 08/16/2020 10:47 AM MRN: [id number] Account #: [id number] Date of Birth: [birth year] Admit Type: Outpatient Age: 40 Room: [facility] OR ROOM 1 Gender: Female Note Status: Finalized Procedure:             Upper GI endoscopy Indications:           Surveillance for malignancy secondary to Lynch Syndrome Providers:             Lucilla Lame MD, MD Referring MD:          [facility] (Referring MD) Medicines:             Propofol per Anesthesia Complications:         No immediate complications. Procedure:             Pre-Anesthesia Assessment:                        - Prior to the procedure, a History and Physical was                         performed, and patient medications and allergies were                         reviewed. The patient's tolerance of previous                         anesthesia was also reviewed. The risks and benefits                         of the procedure and the sedation options and risks                         were discussed with the patient. All questions were                         answered, and informed consent was obtained. Prior                         Anticoagulants: The patient has taken no previous                         anticoagulant or antiplatelet agents. ASA Grade                         Assessment: II - A patient with mild systemic disease.                         After reviewing the risks and benefits, the patient                         was deemed in satisfactory condition to undergo the                         procedure.                        After obtaining informed consent, the endoscope was  passed under direct vision. Throughout the procedure,                         the patient's blood pressure, pulse, and oxygen                         saturations were monitored continuously. The                         Endosonoscope  was introduced through the mouth, and                         advanced to the second part of duodenum. The upper GI                         endoscopy was accomplished without difficulty. The                         patient tolerated the procedure well. Findings:      The examined esophagus was normal.      The entire examined stomach was normal. Biopsies were taken with a cold       forceps for Helicobacter pylori testing.      The examined duodenum was normal. Impression:            - Normal esophagus.                        - Normal stomach. Biopsied.                        - Normal examined duodenum. Recommendation:        - Discharge patient to home.                        - Resume previous diet.                        - Perform a colonoscopy today.                        - Repeat upper endoscopy in 2 years for surveillance. Procedure Code(s):     --- Professional ---                        [phone], Esophagogastroduodenoscopy, flexible,                         transoral; with biopsy, single or multiple Diagnosis Code(s):     --- Professional ---                        Z15.09, Genetic susceptibility to other malignant                         neoplasm CPT copyright 2019 American Medical Association. All rights reserved. The codes documented in this report are preliminary and upon coder review may  be revised to meet current compliance requirements. Lucilla Lame MD, MD 08/16/2020 11:05:42 AM This report has been signed electronically. Number of Addenda: 0 Note Initiated On: 08/16/2020 10:47 AM Total Procedure Duration: 0 hours 3 minutes 1 second  Estimated Blood Loss:  Estimated blood loss: none.      [facility]

## 2020-08-16 NOTE — Anesthesia Preprocedure Evaluation (Signed)
Anesthesia Evaluation  Patient identified by MRN, date of birth, ID band Patient awake    Reviewed: Allergy & Precautions, NPO status   History of Anesthesia Complications (+) PONV  Airway Mallampati: II  TM Distance: >3 FB     Dental   Pulmonary    breath sounds clear to auscultation       Cardiovascular  Rhythm:Regular Rate:Normal  HLD   Neuro/Psych    GI/Hepatic Family history of Lynch syndrome   Endo/Other    Renal/GU      Musculoskeletal   Abdominal   Peds  Hematology   Anesthesia Other Findings   Reproductive/Obstetrics                            Anesthesia Physical Anesthesia Plan  ASA: II  Anesthesia Plan: General   Post-op Pain Management:    Induction: Intravenous  PONV Risk Score and Plan: Propofol infusion, TIVA and Treatment may vary due to age or medical condition  Airway Management Planned: Natural Airway and Nasal Cannula  Additional Equipment:   Intra-op Plan:   Post-operative Plan:   Informed Consent: I have reviewed the patients History and Physical, chart, labs and discussed the procedure including the risks, benefits and alternatives for the proposed anesthesia with the patient or authorized representative who has indicated his/her understanding and acceptance.       Plan Discussed with: CRNA  Anesthesia Plan Comments:         Anesthesia Quick Evaluation

## 2020-08-16 NOTE — Transfer of Care (Signed)
Immediate Anesthesia Transfer of Care Note  Patient: Rhonda Shaw  Procedure(s) Performed: COLONOSCOPY WITH PROPOFOL (N/A Rectum) BIOPSY (N/A Esophagus)  Patient Location: PACU  Anesthesia Type: General  Level of Consciousness: awake, alert  and patient cooperative  Airway and Oxygen Therapy: Patient Spontanous Breathing and Patient connected to supplemental oxygen  Post-op Assessment: Post-op Vital signs reviewed, Patient's Cardiovascular Status Stable, Respiratory Function Stable, Patent Airway and No signs of Nausea or vomiting  Post-op Vital Signs: Reviewed and stable  Complications: No complications documented.

## 2020-08-16 NOTE — H&P (Signed)
Lucilla Lame, MD [facility] [address], [place], [place] [address] [phone] Fax : [phone]  Primary Care Physician:  [facility], [place] Primary Gastroenterologist:  Dr. Allen Norris  Pre-Procedure History & Physical: HPI:  Rhonda Shaw is a 40 y.o. female is here for an endoscopy and colonoscopy.   Past Medical History:  Diagnosis Date  . COVID-19 05/06/2020  . Family history of breast cancer 04/2020   IBIS=18.2%/riskscore=16.5%  . Family history of Lynch syndrome   . Hypercholesterolemia   . Motion sickness    cars  . PMS2-related Lynch syndrome (HNPCC4) 04/2020   [facility] MyRisk  . PONV (postoperative nausea and vomiting)   . Wears contact lenses     Past Surgical History:  Procedure Laterality Date  . APPENDECTOMY  11/2019    Prior to Admission medications   Medication Sig Start Date End Date Taking? Authorizing Provider  Multiple Vitamin (MULTIVITAMINS PO) See admin instructions.   Yes [provider]    Allergies as of 08/11/2020 - Review Complete 08/11/2020  Allergen Reaction Noted  . Sulfa antibiotics Hives 06/25/2014    Family History  Problem Relation Age of Onset  . Breast cancer Paternal Aunt 17       triple neg; Lynch syndrome  . Colon cancer Paternal Aunt 82  . Prostate cancer Paternal Great-grandfather        10s  . Other Sister 76       Lynch Syndrome Positive  . Breast cancer Paternal Aunt        not sure of age  . Prostate cancer Paternal Grandfather 7    Social History   Socioeconomic History  . Marital status: Married    Spouse name: Not on file  . Number of children: Not on file  . Years of education: Not on file  . Highest education level: Not on file  Occupational History  . Not on file  Tobacco Use  . Smoking status: Never Smoker  . Smokeless tobacco: Never Used  Vaping Use  . Vaping Use: Never used  Substance and Sexual Activity  . Alcohol use: No    Comment: rare  . Drug use: No  .  Sexual activity: Yes    Birth control/protection: None  Other Topics Concern  . Not on file  Social History Narrative  . Not on file   Social Determinants of Health   Financial Resource Strain: Not on file  Food Insecurity: Not on file  Transportation Needs: Not on file  Physical Activity: Not on file  Stress: Not on file  Social Connections: Not on file  Intimate Partner Violence: Not on file    Review of Systems: See HPI, otherwise negative ROS  Physical Exam: BP 124/78   Pulse 87   Temp 97.6 F (36.4 C) (Temporal)   Ht 5' (1.524 m)   Wt 68.5 kg   LMP 07/28/2020 (Approximate) Comment: preg test neg  SpO2 100%   BMI 29.49 kg/m  General:   Alert,  pleasant and cooperative in NAD Head:  Normocephalic and atraumatic. Neck:  Supple; no masses or thyromegaly. Lungs:  Clear throughout to auscultation.    Heart:  Regular rate and rhythm. Abdomen:  Soft, nontender and nondistended. Normal bowel sounds, without guarding, and without rebound.   Neurologic:  Alert and  oriented x4;  grossly normal neurologically.  Impression/Plan: Rhonda Shaw is here for an endoscopy and colonoscopy to be performed for lynch syndrome  Risks, benefits, limitations, and alternatives regarding  endoscopy  and colonoscopy have been reviewed with the patient.  Questions have been answered.  All parties agreeable.   Lucilla Lame, MD  08/16/2020, 10:51 AM

## 2020-08-16 NOTE — Op Note (Signed)
[facility] Gastroenterology Patient Name: Rhonda Shaw Procedure Date: 08/16/2020 10:44 AM MRN: [id number] Account #: [id number] Date of Birth: 1980-10-03 Admit Type: Outpatient Age: 40 Room: [facility] OR ROOM 01 Gender: Female Note Status: Finalized Procedure:             Colonoscopy Indications:           Lynch Syndrome Providers:             Lucilla Lame MD, MD Medicines:             Propofol per Anesthesia Complications:         No immediate complications. Procedure:             Pre-Anesthesia Assessment:                        - Prior to the procedure, a History and Physical was                         performed, and patient medications and allergies were                         reviewed. The patient's tolerance of previous                         anesthesia was also reviewed. The risks and benefits                         of the procedure and the sedation options and risks                         were discussed with the patient. All questions were                         answered, and informed consent was obtained. Prior                         Anticoagulants: The patient has taken no previous                         anticoagulant or antiplatelet agents. ASA Grade                         Assessment: II - A patient with mild systemic disease.                         After reviewing the risks and benefits, the patient                         was deemed in satisfactory condition to undergo the                         procedure.                        After obtaining informed consent, the colonoscope was                         passed under direct vision. Throughout the procedure,  the patient's blood pressure, pulse, and oxygen                         saturations were monitored continuously. The was                         introduced through the anus and advanced to the the                         cecum, identified by appendiceal orifice and  ileocecal                         valve. The colonoscopy was performed without                         difficulty. The patient tolerated the procedure well.                         The quality of the bowel preparation was excellent. Findings:      The perianal and digital rectal examinations were normal.      The colon (entire examined portion) appeared normal. Impression:            - The entire examined colon is normal.                        - No specimens collected. Recommendation:        - Discharge patient to home.                        - Resume previous diet.                        - Continue present medications.                        - Repeat colonoscopy in 1 year for surveillance. Procedure Code(s):     --- Professional ---                        [phone], Colonoscopy, flexible; diagnostic, including                         collection of specimen(s) by brushing or washing, when                         performed (separate procedure) Diagnosis Code(s):     --- Professional ---                        Z15.09, Genetic susceptibility to other malignant                         neoplasm CPT copyright 2019 American Medical Association. All rights reserved. The codes documented in this report are preliminary and upon coder review may  be revised to meet current compliance requirements. Lucilla Lame MD, MD 08/16/2020 11:17:41 AM This report has been signed electronically. Number of Addenda: 0 Note Initiated On: 08/16/2020 10:44 AM Scope Withdrawal Time: 0 hours 6 minutes 44 seconds  Total Procedure Duration: 0 hours 8 minutes 18 seconds  Estimated Blood Loss:  Estimated blood loss: none.      [facility]

## 2020-08-16 NOTE — Anesthesia Postprocedure Evaluation (Signed)
Anesthesia Post Note  Patient: Rhonda Shaw  Procedure(s) Performed: COLONOSCOPY WITH PROPOFOL (N/A Rectum) BIOPSY (N/A Esophagus) ESOPHAGOGASTRODUODENOSCOPY (EGD) (N/A Throat)     Patient location during evaluation: PACU Anesthesia Type: General Level of consciousness: awake Pain management: pain level controlled Vital Signs Assessment: post-procedure vital signs reviewed and stable Respiratory status: respiratory function stable Cardiovascular status: stable Postop Assessment: no signs of nausea or vomiting Anesthetic complications: no   No complications documented.  Veda Canning

## 2020-08-18 ENCOUNTER — Encounter: Payer: Self-pay | Admitting: Gastroenterology

## 2020-08-18 LAB — SURGICAL PATHOLOGY

## 2020-08-19 ENCOUNTER — Telehealth: Payer: Self-pay

## 2020-08-19 ENCOUNTER — Telehealth: Payer: Self-pay | Admitting: Obstetrics and Gynecology

## 2020-08-19 ENCOUNTER — Encounter: Payer: Self-pay | Admitting: Gastroenterology

## 2020-08-19 NOTE — Telephone Encounter (Signed)
-----  Message from [name], MD sent at 08/13/2020  8:52 AM EST ----- Regarding: Surgery Surgery Date:   LOS: same day surgery  Surgery Booking Request Patient Full Name: Rhonda Shaw MRN: [id number]  DOB: 06-05-1981  Surgeon: [name], MD  Requested Surgery Date and Time: Patient preference Primary Diagnosis and Code: PMS2 mutation carrier/lynch syndrome Secondary Diagnosis and Code:  Surgical Procedure: TLH, BS, cystoscopy L&D Notification:N/A Admission Status: same day surgery Length of Surgery: 2hrs Special Case Needs: none H&P: 2 weeks prior (date) Phone Interview or Office Pre-Admit: pre-admit Interpreter: No Language: English Medical Clearance: No Special Scheduling Instructions: H&P 2 weeks prior for endometrial biopsy preop

## 2020-08-19 NOTE — Telephone Encounter (Signed)
Pt called with questions about Birads 0 mammo. Has addl imaging sched for 08/23/20. Discussed findings and next steps. Will await addl imaging results. Pt with FH breast cancer but not at increased risk of breast cancer> 20 %.  Pt has Lynch syndrome. Is planning on hyst with AS in a few months.

## 2020-08-19 NOTE — Telephone Encounter (Signed)
Called pt to offer DOS that Rhonda Shaw has available. She is trying to plan her surgery date around vacations and a change in her insurance effective in June. She took the April dates down and would like to discuss this with her husband and call me back to schedule.

## 2020-08-23 ENCOUNTER — Ambulatory Visit
Admission: RE | Admit: 2020-08-23 | Discharge: 2020-08-23 | Disposition: A | Discharge status: Home / Self Care | Payer: No Typology Code available for payment source | Source: Ambulatory Visit | Attending: Obstetrics and Gynecology | Admitting: Obstetrics and Gynecology

## 2020-08-23 ENCOUNTER — Other Ambulatory Visit: Payer: Self-pay

## 2020-08-23 DIAGNOSIS — R922 Inconclusive mammogram: Secondary | ICD-10-CM | POA: Diagnosis not present

## 2020-08-23 DIAGNOSIS — R928 Other abnormal and inconclusive findings on diagnostic imaging of breast: Secondary | ICD-10-CM | POA: Insufficient documentation

## 2020-08-23 DIAGNOSIS — N6310 Unspecified lump in the right breast, unspecified quadrant: Secondary | ICD-10-CM | POA: Insufficient documentation

## 2020-08-23 DIAGNOSIS — N6001 Solitary cyst of right breast: Secondary | ICD-10-CM | POA: Diagnosis not present

## 2020-09-14 ENCOUNTER — Other Ambulatory Visit: Payer: Self-pay

## 2020-09-14 ENCOUNTER — Encounter: Payer: Self-pay | Admitting: Obstetrics and Gynecology

## 2020-09-14 ENCOUNTER — Ambulatory Visit (INDEPENDENT_AMBULATORY_CARE_PROVIDER_SITE_OTHER): Payer: No Typology Code available for payment source | Admitting: Obstetrics and Gynecology

## 2020-09-14 ENCOUNTER — Other Ambulatory Visit (HOSPITAL_COMMUNITY)
Admission: RE | Admit: 2020-09-14 | Discharge: 2020-09-14 | Disposition: A | Discharge status: Home / Self Care | Payer: No Typology Code available for payment source | Source: Ambulatory Visit | Attending: Obstetrics and Gynecology | Admitting: Obstetrics and Gynecology

## 2020-09-14 VITALS — BP 128/74 | Ht 60.0 in | Wt 152.0 lb

## 2020-09-14 DIAGNOSIS — Z1509 Genetic susceptibility to other malignant neoplasm: Secondary | ICD-10-CM

## 2020-09-14 DIAGNOSIS — Z01818 Encounter for other preprocedural examination: Secondary | ICD-10-CM

## 2020-09-14 NOTE — H&P (View-Only) (Signed)
Obstetrics & Gynecology Surgery H&P    Chief Complaint: Scheduled Surgery   History of Present Illness: Patient is a 40 y.o. [id number] presenting for scheduled TLH, BS, cystoscopy, for the treatment or further evaluation of PMS2 associated Lynch Syndrome  Prior Treatments prior to proceeding with surgery include: discussion of current literature and documentation regarding Lynch Syndrome  Preoperative Pap: 03/23/2020 Results: NILM HPV negative  Preoperative Endometrial biopsy: obtained today Preoperative Ultrasound:N/A   Review of Systems:10 point review of systems  Past Medical History:  Patient Active Problem List   Diagnosis Date Noted  . Lynch syndrome   . PMS2-related Lynch syndrome (HNPCC4) 05/11/2020  . Family history of Lynch syndrome 03/23/2020  . Appendicitis 11/11/2019  . PONV (postoperative nausea and vomiting) 11/11/2019    Past Surgical History:  Past Surgical History:  Procedure Laterality Date  . APPENDECTOMY  11/2019  . BIOPSY N/A 08/16/2020   Procedure: BIOPSY;  Surgeon: Lucilla Lame, MD;  Location: [facility];  Service: Endoscopy;  Laterality: N/A;  . COLONOSCOPY WITH PROPOFOL N/A 08/16/2020   Procedure: COLONOSCOPY WITH PROPOFOL;  Surgeon: Lucilla Lame, MD;  Location: [facility];  Service: Endoscopy;  Laterality: N/A;  priority 4  . ESOPHAGOGASTRODUODENOSCOPY N/A 08/16/2020   Procedure: ESOPHAGOGASTRODUODENOSCOPY (EGD);  Surgeon: Lucilla Lame, MD;  Location: [place];  Service: Endoscopy;  Laterality: N/A;    Family History:  Family History  Problem Relation Age of Onset  . Breast cancer Paternal Aunt 45       triple neg; Lynch syndrome  . Colon cancer Paternal Aunt 61  . Prostate cancer Paternal Great-grandfather        60s  . Other Sister 5       Lynch Syndrome Positive  . Breast cancer Paternal Aunt        not sure of age  . Prostate cancer Paternal Grandfather 67    Social History:  Social History    Socioeconomic History  . Marital status: Married    Spouse name: Not on file  . Number of children: Not on file  . Years of education: Not on file  . Highest education level: Not on file  Occupational History  . Not on file  Tobacco Use  . Smoking status: Never Smoker  . Smokeless tobacco: Never Used  Vaping Use  . Vaping Use: Never used  Substance and Sexual Activity  . Alcohol use: No    Comment: rare  . Drug use: No  . Sexual activity: Yes    Birth control/protection: None  Other Topics Concern  . Not on file  Social History Narrative  . Not on file   Social Determinants of Health   Financial Resource Strain: Not on file  Food Insecurity: Not on file  Transportation Needs: Not on file  Physical Activity: Not on file  Stress: Not on file  Social Connections: Not on file  Intimate Partner Violence: Not on file    Allergies:  Allergies  Allergen Reactions  . Sulfa Antibiotics Hives    Medications: Prior to Admission medications   Medication Sig Start Date End Date Taking? Authorizing Provider  Multiple Vitamin (MULTIVITAMINS PO) Take 1 tablet by mouth daily.    [provider]    Physical Exam Vitals: Blood pressure 128/74, height 5' (1.524 m), weight 152 lb (68.9 kg), last menstrual period 08/23/2020. General: NAD HEENT: normocephalic, anicteric Pulmonary: No increased work of breathing, CTAB Cardiovascular: RRR, distal pulses 2+ Abdomen: soft, non-tender, non-distended Genitourinary:  Genitourinary:  External: Normal external female genitalia.  Normal urethral meatus, normal Bartholin's and Skene's glands.    Vagina: Normal vaginal mucosa, no evidence of prolapse.    Cervix: Grossly normal in appearance, no bleeding  Uterus: Non-enlarged, mobile, normal contour.  No CMT   Rectal: deferred Extremities: no edema, erythema, or tenderness Neurologic: Grossly intact Psychiatric: mood appropriate, affect full   ENDOMETRIAL BIOPSY     The  indications for endometrial biopsy were reviewed.   Risks of the biopsy including cramping, bleeding, infection, uterine perforation, inadequate specimen and need for additional procedures  were discussed. The patient states she understands and agrees to undergo procedure today. Consent was signed. Time out was performed. Urine HCG was negative. A Graves speculum was placed and the cervix was brought into view.  The cervix was prepped with Betadine. A single-toothed tenaculum was  placed on the anterior lip of the cervix for traction. A 3 mm pipelle was introduced through the cervix into the endometrial cavity without difficulty to a depth of 8cm, and a small amount of tissue was obtained, the resulting specime sent to pathology. The instruments were removed from the patient's vagina. Minimal bleeding from the cervix was noted. The patient tolerated the procedure well. Routine post-procedure instructions were given to the patient.  She will be contacted by phone one results become available.     Imaging US BREAST LTD UNI RIGHT INC AXILLA  Result Date: 08/23/2020 CLINICAL DATA:  40 year old female presenting as a recall from baseline screening for possible right breast mass. EXAM: DIGITAL DIAGNOSTIC UNILATERAL RIGHT MAMMOGRAM WITH TOMOSYNTHESIS AND CAD; ULTRASOUND RIGHT BREAST LIMITED TECHNIQUE: Right digital diagnostic mammography and breast tomosynthesis was performed. The images were evaluated with computer-aided detection.; Targeted ultrasound examination of the right breast was performed COMPARISON:  Previous exam(s). ACR Breast Density Category c: The breast tissue is heterogeneously dense, which may obscure small masses. FINDINGS: Mammogram: Spot compression tomosynthesis views of the right breast were performed demonstrating persistence of an oval circumscribed mass in the outer right breast posterior depth measuring approximately 5 mm, best seen on the spot cc view. Ultrasound: Targeted ultrasound is  performed in the right breast at 8:30 o'clock 8 cm from the nipple demonstrating an oval circumscribed anechoic bilobed mass measuring 0.8 x 0.4 x 0.5 cm, consistent with a benign simple cyst. No internal vascularity. This corresponds to the mammographic finding. IMPRESSION: Benign simple cyst in the right breast at 8:30 o'clock. No mammographic or sonographic evidence of malignancy. RECOMMENDATION: Screening mammogram in one year.(Code:SM-B-01Y) I have discussed the findings and recommendations with the patient. If applicable, a reminder letter will be sent to the patient regarding the next appointment. BI-RADS CATEGORY  2: Benign. Electronically Signed   By: Audie Pinto M.D.   On: 08/23/2020 15:26   MM DIAG BREAST TOMO UNI RIGHT  Result Date: 08/23/2020 CLINICAL DATA:  40 year old female presenting as a recall from baseline screening for possible right breast mass. EXAM: DIGITAL DIAGNOSTIC UNILATERAL RIGHT MAMMOGRAM WITH TOMOSYNTHESIS AND CAD; ULTRASOUND RIGHT BREAST LIMITED TECHNIQUE: Right digital diagnostic mammography and breast tomosynthesis was performed. The images were evaluated with computer-aided detection.; Targeted ultrasound examination of the right breast was performed COMPARISON:  Previous exam(s). ACR Breast Density Category c: The breast tissue is heterogeneously dense, which may obscure small masses. FINDINGS: Mammogram: Spot compression tomosynthesis views of the right breast were performed demonstrating persistence of an oval circumscribed mass in the outer right breast posterior depth measuring approximately 5 mm, best seen on the spot cc  view. Ultrasound: Targeted ultrasound is performed in the right breast at 8:30 o'clock 8 cm from the nipple demonstrating an oval circumscribed anechoic bilobed mass measuring 0.8 x 0.4 x 0.5 cm, consistent with a benign simple cyst. No internal vascularity. This corresponds to the mammographic finding. IMPRESSION: Benign simple cyst in the right  breast at 8:30 o'clock. No mammographic or sonographic evidence of malignancy. RECOMMENDATION: Screening mammogram in one year.(Code:SM-B-01Y) I have discussed the findings and recommendations with the patient. If applicable, a reminder letter will be sent to the patient regarding the next appointment. BI-RADS CATEGORY  2: Benign. Electronically Signed   By: Audie Pinto M.D.   On: 08/23/2020 15:26    Assessment: 40 y.o. [id number] presenting for scheduled TLH, BS, cystoscopy  Plan: 1) We discussed WHI study findings in detail.  In the combined estrogen-progesterone arm breast cancer risk was increased by 1.26 (CI of 1.00 to 1.59), coronary heart disease 1.29 (CI 1.02-1.63), stroke risk 1.41 (1.07-1.85), and pulmonary embolism 2.13 (CI 1.39-3.25).  That being said the while statistically significant the actual number of cases attributable are relatively small at an additional 8 cases of breast cancer, 7 more coronary artery event, 8 more strokes, and 8 additional case of pulmonary embolism per 10,000 women.  Study was terminated because of the increased breast cancer risk, this was not seen in the estrogen only arm of the study for women without an intact uterus.  In addition it is important to note that HRT also had positive or risk reducing effects, and all cause mortality between the HRT/non-HRT users is not statistically different.  Estrogen-progestin HRT decreased the relative risk of hip fracture 0.66 (CI 0.45-0.98), colorectal cancer 0.63 (0.43-0.92).  Current consensus is to limit dose to the lowest effective dose, and shortest treatment duration possible.  Breast cancer risk appeared to increase after 4 years of use.  Also important to note is that these risk refer to systemic HRT for the treatment of vasomotor symptoms, and do not apply to vaginal preperations with minimal systemic absorption and aimed at treating symptoms of vulvovaginal atrophy.    We briefly touched on findings of WHIMS  trial published in 2005 which looked at women 2 year of age or older, and whether HRT was protective against the development of dementia.  The study revealed that HRT actually increased the risk for the development of dementia but was limited by looking only at patients 68 years of age and older.  The subsequent KEEPS trial  In 2012 which looked at HRT in recently postmenopausal women did not show any improvement in cognitive function for women on HRT.  However, there was also no significant cognitive declines seen in recently postmenopausal women receiving HRT as had previously been shown in the [facility] trial.     2) Routine postoperative instructions were reviewed with the patient and her family in detail today including the expected length of recovery and likely postoperative course.  The patient concurred with the proposed plan, giving informed written consent for the surgery today.  Patient instructed on the importance of being NPO after midnight prior to her procedure.  If warranted preoperative prophylactic antibiotics and SCDs ordered on call to the OR to meet SCIP guidelines and adhere to recommendation laid forth in [place] Number 104 May 2009  "Antibiotic Prophylaxis for Gynecologic Procedures".     [name], MD, [facility], [facility] 09/14/2020, 12:20 PM

## 2020-09-14 NOTE — Progress Notes (Signed)
Obstetrics & Gynecology Surgery H&P    Chief Complaint: Scheduled Surgery   History of Present Illness: Patient is a 40 y.o. [id number] presenting for scheduled TLH, BS, cystoscopy, for the treatment or further evaluation of PMS2 associated Lynch Syndrome  Prior Treatments prior to proceeding with surgery include: discussion of current literature and documentation regarding Lynch Syndrome  Preoperative Pap: 03/23/2020 Results: NILM HPV negative  Preoperative Endometrial biopsy: obtained today Preoperative Ultrasound:N/A   Review of Systems:10 point review of systems  Past Medical History:  Patient Active Problem List   Diagnosis Date Noted  . Lynch syndrome   . PMS2-related Lynch syndrome (HNPCC4) 05/11/2020  . Family history of Lynch syndrome 03/23/2020  . Appendicitis 11/11/2019  . PONV (postoperative nausea and vomiting) 11/11/2019    Past Surgical History:  Past Surgical History:  Procedure Laterality Date  . APPENDECTOMY  11/2019  . BIOPSY N/A 08/16/2020   Procedure: BIOPSY;  Surgeon: Lucilla Lame, MD;  Location: [place];  Service: Endoscopy;  Laterality: N/A;  . COLONOSCOPY WITH PROPOFOL N/A 08/16/2020   Procedure: COLONOSCOPY WITH PROPOFOL;  Surgeon: Lucilla Lame, MD;  Location: [facility];  Service: Endoscopy;  Laterality: N/A;  priority 4  . ESOPHAGOGASTRODUODENOSCOPY N/A 08/16/2020   Procedure: ESOPHAGOGASTRODUODENOSCOPY (EGD);  Surgeon: Lucilla Lame, MD;  Location: [facility];  Service: Endoscopy;  Laterality: N/A;    Family History:  Family History  Problem Relation Age of Onset  . Breast cancer Paternal Aunt 62       triple neg; Lynch syndrome  . Colon cancer Paternal Aunt 66  . Prostate cancer Paternal Great-grandfather        51s  . Other Sister 29       Lynch Syndrome Positive  . Breast cancer Paternal Aunt        not sure of age  . Prostate cancer Paternal Grandfather 50    Social History:  Social History    Socioeconomic History  . Marital status: Married    Spouse name: Not on file  . Number of children: Not on file  . Years of education: Not on file  . Highest education level: Not on file  Occupational History  . Not on file  Tobacco Use  . Smoking status: Never Smoker  . Smokeless tobacco: Never Used  Vaping Use  . Vaping Use: Never used  Substance and Sexual Activity  . Alcohol use: No    Comment: rare  . Drug use: No  . Sexual activity: Yes    Birth control/protection: None  Other Topics Concern  . Not on file  Social History Narrative  . Not on file   Social Determinants of Health   Financial Resource Strain: Not on file  Food Insecurity: Not on file  Transportation Needs: Not on file  Physical Activity: Not on file  Stress: Not on file  Social Connections: Not on file  Intimate Partner Violence: Not on file    Allergies:  Allergies  Allergen Reactions  . Sulfa Antibiotics Hives    Medications: Prior to Admission medications   Medication Sig Start Date End Date Taking? Authorizing Provider  Multiple Vitamin (MULTIVITAMINS PO) Take 1 tablet by mouth daily.    [provider]    Physical Exam Vitals: Blood pressure 128/74, height 5' (1.524 m), weight 152 lb (68.9 kg), last menstrual period 08/23/2020. General: NAD HEENT: normocephalic, anicteric Pulmonary: No increased work of breathing, CTAB Cardiovascular: RRR, distal pulses 2+ Abdomen: soft, non-tender, non-distended Genitourinary:  Genitourinary:  External: Normal external female genitalia.  Normal urethral meatus, normal Bartholin's and Skene's glands.    Vagina: Normal vaginal mucosa, no evidence of prolapse.    Cervix: Grossly normal in appearance, no bleeding  Uterus: Non-enlarged, mobile, normal contour.  No CMT   Rectal: deferred Extremities: no edema, erythema, or tenderness Neurologic: Grossly intact Psychiatric: mood appropriate, affect full   ENDOMETRIAL BIOPSY     The  indications for endometrial biopsy were reviewed.   Risks of the biopsy including cramping, bleeding, infection, uterine perforation, inadequate specimen and need for additional procedures  were discussed. The patient states she understands and agrees to undergo procedure today. Consent was signed. Time out was performed. Urine HCG was negative. A Graves speculum was placed and the cervix was brought into view.  The cervix was prepped with Betadine. A single-toothed tenaculum was  placed on the anterior lip of the cervix for traction. A 3 mm pipelle was introduced through the cervix into the endometrial cavity without difficulty to a depth of 8cm, and a small amount of tissue was obtained, the resulting specime sent to pathology. The instruments were removed from the patient's vagina. Minimal bleeding from the cervix was noted. The patient tolerated the procedure well. Routine post-procedure instructions were given to the patient.  She will be contacted by phone one results become available.     Imaging US BREAST LTD UNI RIGHT INC AXILLA  Result Date: 08/23/2020 CLINICAL DATA:  40 year old female presenting as a recall from baseline screening for possible right breast mass. EXAM: DIGITAL DIAGNOSTIC UNILATERAL RIGHT MAMMOGRAM WITH TOMOSYNTHESIS AND CAD; ULTRASOUND RIGHT BREAST LIMITED TECHNIQUE: Right digital diagnostic mammography and breast tomosynthesis was performed. The images were evaluated with computer-aided detection.; Targeted ultrasound examination of the right breast was performed COMPARISON:  Previous exam(s). ACR Breast Density Category c: The breast tissue is heterogeneously dense, which may obscure small masses. FINDINGS: Mammogram: Spot compression tomosynthesis views of the right breast were performed demonstrating persistence of an oval circumscribed mass in the outer right breast posterior depth measuring approximately 5 mm, best seen on the spot cc view. Ultrasound: Targeted ultrasound is  performed in the right breast at 8:30 o'clock 8 cm from the nipple demonstrating an oval circumscribed anechoic bilobed mass measuring 0.8 x 0.4 x 0.5 cm, consistent with a benign simple cyst. No internal vascularity. This corresponds to the mammographic finding. IMPRESSION: Benign simple cyst in the right breast at 8:30 o'clock. No mammographic or sonographic evidence of malignancy. RECOMMENDATION: Screening mammogram in one year.(Code:SM-B-01Y) I have discussed the findings and recommendations with the patient. If applicable, a reminder letter will be sent to the patient regarding the next appointment. BI-RADS CATEGORY  2: Benign. Electronically Signed   By: Audie Pinto M.D.   On: 08/23/2020 15:26   MM DIAG BREAST TOMO UNI RIGHT  Result Date: 08/23/2020 CLINICAL DATA:  40 year old female presenting as a recall from baseline screening for possible right breast mass. EXAM: DIGITAL DIAGNOSTIC UNILATERAL RIGHT MAMMOGRAM WITH TOMOSYNTHESIS AND CAD; ULTRASOUND RIGHT BREAST LIMITED TECHNIQUE: Right digital diagnostic mammography and breast tomosynthesis was performed. The images were evaluated with computer-aided detection.; Targeted ultrasound examination of the right breast was performed COMPARISON:  Previous exam(s). ACR Breast Density Category c: The breast tissue is heterogeneously dense, which may obscure small masses. FINDINGS: Mammogram: Spot compression tomosynthesis views of the right breast were performed demonstrating persistence of an oval circumscribed mass in the outer right breast posterior depth measuring approximately 5 mm, best seen on the spot cc  view. Ultrasound: Targeted ultrasound is performed in the right breast at 8:30 o'clock 8 cm from the nipple demonstrating an oval circumscribed anechoic bilobed mass measuring 0.8 x 0.4 x 0.5 cm, consistent with a benign simple cyst. No internal vascularity. This corresponds to the mammographic finding. IMPRESSION: Benign simple cyst in the right  breast at 8:30 o'clock. No mammographic or sonographic evidence of malignancy. RECOMMENDATION: Screening mammogram in one year.(Code:SM-B-01Y) I have discussed the findings and recommendations with the patient. If applicable, a reminder letter will be sent to the patient regarding the next appointment. BI-RADS CATEGORY  2: Benign. Electronically Signed   By: Audie Pinto M.D.   On: 08/23/2020 15:26    Assessment: 40 y.o. [id number] presenting for scheduled TLH, BS, cystoscopy  Plan: 1) We discussed WHI study findings in detail.  In the combined estrogen-progesterone arm breast cancer risk was increased by 1.26 (CI of 1.00 to 1.59), coronary heart disease 1.29 (CI 1.02-1.63), stroke risk 1.41 (1.07-1.85), and pulmonary embolism 2.13 (CI 1.39-3.25).  That being said the while statistically significant the actual number of cases attributable are relatively small at an additional 8 cases of breast cancer, 7 more coronary artery event, 8 more strokes, and 8 additional case of pulmonary embolism per 10,000 women.  Study was terminated because of the increased breast cancer risk, this was not seen in the estrogen only arm of the study for women without an intact uterus.  In addition it is important to note that HRT also had positive or risk reducing effects, and all cause mortality between the HRT/non-HRT users is not statistically different.  Estrogen-progestin HRT decreased the relative risk of hip fracture 0.66 (CI 0.45-0.98), colorectal cancer 0.63 (0.43-0.92).  Current consensus is to limit dose to the lowest effective dose, and shortest treatment duration possible.  Breast cancer risk appeared to increase after 4 years of use.  Also important to note is that these risk refer to systemic HRT for the treatment of vasomotor symptoms, and do not apply to vaginal preperations with minimal systemic absorption and aimed at treating symptoms of vulvovaginal atrophy.    We briefly touched on findings of WHIMS  trial published in 2005 which looked at women 47 year of age or older, and whether HRT was protective against the development of dementia.  The study revealed that HRT actually increased the risk for the development of dementia but was limited by looking only at patients 5 years of age and older.  The subsequent KEEPS trial  In 2012 which looked at HRT in recently postmenopausal women did not show any improvement in cognitive function for women on HRT.  However, there was also no significant cognitive declines seen in recently postmenopausal women receiving HRT as had previously been shown in the [facility] trial.     2) Routine postoperative instructions were reviewed with the patient and her family in detail today including the expected length of recovery and likely postoperative course.  The patient concurred with the proposed plan, giving informed written consent for the surgery today.  Patient instructed on the importance of being NPO after midnight prior to her procedure.  If warranted preoperative prophylactic antibiotics and SCDs ordered on call to the OR to meet SCIP guidelines and adhere to recommendation laid forth in [place] Number 104 May 2009  "Antibiotic Prophylaxis for Gynecologic Procedures".     [name], MD, [facility], [facility] 09/14/2020, 12:20 PM

## 2020-09-15 NOTE — Telephone Encounter (Signed)
Called patient to schedule TLH/BS, cystoscopy w [facility]  DOS 4/26  H&P 4/12 @ 11:30   Covid testing 4/22 @ 9:05, [place], [address]. Advised pt to quarantine until DOS.  Pre-admit phone call appointment 4/18 @ 8:00am - 1:00 pm  Advised that pt may also receive calls from the hospital pharmacy and pre-service center.  Confirmed pt has [employer] as Chartered certified accountant. No secondary insurance.

## 2020-09-16 LAB — SURGICAL PATHOLOGY

## 2020-09-20 ENCOUNTER — Other Ambulatory Visit
Admission: RE | Admit: 2020-09-20 | Discharge: 2020-09-20 | Disposition: A | Discharge status: Home / Self Care | Payer: No Typology Code available for payment source | Source: Ambulatory Visit | Attending: Obstetrics and Gynecology | Admitting: Obstetrics and Gynecology

## 2020-09-20 ENCOUNTER — Other Ambulatory Visit: Payer: Self-pay

## 2020-09-20 HISTORY — DX: Pneumonia, unspecified organism: J18.9

## 2020-09-20 NOTE — Patient Instructions (Addendum)
Your procedure is scheduled on:  Tuesday, April 26 Report to the Registration Desk on the 1st floor of the [facility]. To find out your arrival time, please call [phone] between 1PM - 3PM on: Monday, April 25  REMEMBER: Instructions that are not followed completely may result in serious medical risk, up to and including death; or upon the discretion of your surgeon and anesthesiologist your surgery may need to be rescheduled.  Do not eat food after midnight the night before surgery.  No gum chewing, lozengers or hard candies.  You may however, drink CLEAR liquids up to 2 hours before you are scheduled to arrive for your surgery. Do not drink anything within 2 hours of your scheduled arrival time.  Clear liquids include: - water  - apple juice without pulp - gatorade (not RED, PURPLE, OR BLUE) - black coffee or tea (Do NOT add milk or creamers to the coffee or tea) Do NOT drink anything that is not on this list.  DO NOT TAKE ANY MEDICATIONS THE MORNING OF SURGERY  One week prior to surgery: STARTING April 19 Stop Anti-inflammatories (NSAIDS) such as Advil, Aleve, Ibuprofen, Motrin, Naproxen, Naprosyn and Aspirin based products such as Excedrin, Goodys Powder, BC Powder. Stop ANY OVER THE COUNTER supplements until after surgery.  No Alcohol for 24 hours before or after surgery.  No Smoking including e-cigarettes for 24 hours prior to surgery.  No chewable tobacco products for at least 6 hours prior to surgery.  No nicotine patches on the day of surgery.  Do not use any "recreational" drugs for at least a week prior to your surgery.  Please be advised that the combination of cocaine and anesthesia may have negative outcomes, up to and including death. If you test positive for cocaine, your surgery will be cancelled.  On the morning of surgery brush your teeth with toothpaste and water, you may rinse your mouth with mouthwash if you wish. Do not swallow any toothpaste or  mouthwash.  Do not wear jewelry, make-up, hairpins, clips or nail polish.  Do not wear lotions, powders, or perfumes.   Do not shave body from the neck down 48 hours prior to surgery just in case you cut yourself which could leave a site for infection.  Also, freshly shaved skin may become irritated if using the CHG soap.  Contact lenses, hearing aids and dentures may not be worn into surgery.  Do not bring valuables to the hospital. [facility] is not responsible for any missing/lost belongings or valuables.   Use CHG Soap as directed on instruction sheet.  Notify your doctor if there is any change in your medical condition (cold, fever, infection).  Wear comfortable clothing (specific to your surgery type) to the hospital.  Plan for stool softeners for home use; pain medications have a tendency to cause constipation. You can also help prevent constipation by eating foods high in fiber such as fruits and vegetables and drinking plenty of fluids as your diet allows.  After surgery, you can help prevent lung complications by doing breathing exercises.  Take deep breaths and cough every 1-2 hours. Your doctor may order a device called an Incentive Spirometer to help you take deep breaths. When coughing or sneezing, hold a pillow firmly against your incision with both hands. This is called "splinting." Doing this helps protect your incision. It also decreases belly discomfort.  If you are being admitted to the hospital overnight, leave your suitcase in the car. After surgery it  may be brought to your room.  If you are being discharged the day of surgery, you will not be allowed to drive home. You will need a responsible adult (18 years or older) to drive you home and stay with you that night.   If you are taking public transportation, you will need to have a responsible adult (18 years or older) with you. Please confirm with your physician that it is acceptable to use public  transportation.   Please call the [place] [facility] [facility] at [phone] if you have any questions about these instructions.  Surgery Visitation Policy:  Patients undergoing a surgery or procedure may have one family member or support person with them as long as that person is not COVID-19 positive or experiencing its symptoms.  That person may remain in the waiting area during the procedure.  Inpatient Visitation:    Visiting hours are 7 a.m. to 8 p.m. Inpatients will be allowed two visitors daily. The visitors may change each day during the patient's stay. No visitors under the age of 31. Any visitor under the age of 38 must be accompanied by an adult. The visitor must pass COVID-19 screenings, use hand sanitizer when entering and exiting the patient's room and wear a mask at all times, including in the patient's room. Patients must also wear a mask when staff or their visitor are in the room. Masking is required regardless of vaccination status.

## 2020-09-24 ENCOUNTER — Other Ambulatory Visit: Payer: Self-pay

## 2020-09-24 ENCOUNTER — Other Ambulatory Visit
Admission: RE | Admit: 2020-09-24 | Discharge: 2020-09-24 | Disposition: A | Discharge status: Home / Self Care | Payer: No Typology Code available for payment source | Source: Ambulatory Visit | Attending: Obstetrics and Gynecology | Admitting: Obstetrics and Gynecology

## 2020-09-24 ENCOUNTER — Other Ambulatory Visit: Payer: No Typology Code available for payment source

## 2020-09-24 DIAGNOSIS — Z01812 Encounter for preprocedural laboratory examination: Secondary | ICD-10-CM | POA: Diagnosis not present

## 2020-09-24 DIAGNOSIS — Z20822 Contact with and (suspected) exposure to covid-19: Secondary | ICD-10-CM | POA: Diagnosis not present

## 2020-09-24 LAB — BASIC METABOLIC PANEL
Anion gap: 10 (ref 5–15)
BUN: 9 mg/dL (ref 6–20)
CO2: 24 mmol/L (ref 22–32)
Calcium: 9.3 mg/dL (ref 8.9–10.3)
Chloride: 107 mmol/L (ref 98–111)
Creatinine, Ser: 0.65 mg/dL (ref 0.44–1.00)
GFR, Estimated: >60 mL/min (ref >60)
Glucose, Bld: 91 mg/dL (ref 70–99)
Potassium: 3.8 mmol/L (ref 3.5–5.1)
Sodium: 141 mmol/L (ref 135–145)

## 2020-09-24 LAB — CBC
HCT: 37.9 % (ref 36.0–46.0)
Hemoglobin: 12.9 g/dL (ref 12.0–15.0)
MCH: 30.7 pg (ref 26.0–34.0)
MCHC: 34 g/dL (ref 30.0–36.0)
MCV: 90.2 fL (ref 80.0–100.0)
Platelets: 344 10*3/uL (ref 150–400)
RBC: 4.2 MIL/uL (ref 3.87–5.11)
RDW: 12.1 % (ref 11.5–15.5)
WBC: 7.2 10*3/uL (ref 4.0–10.5)
nRBC: 0 % (ref 0.0–0.2)

## 2020-09-24 LAB — SARS CORONAVIRUS 2 (TAT 6-24 HRS): SARS Coronavirus 2: NEGATIVE

## 2020-09-27 LAB — TYPE AND SCREEN
ABO/RH(D): O POS
Antibody Screen: NEGATIVE

## 2020-09-27 MED ORDER — ORAL CARE MOUTH RINSE: 15.0000 mL | Freq: Once | OROMUCOSAL | Status: AC

## 2020-09-27 MED ORDER — LACTATED RINGERS IV SOLN: INTRAVENOUS | Status: DC

## 2020-09-27 MED ORDER — FAMOTIDINE 20 MG PO TABS: 20.0000 mg | ORAL_TABLET | Freq: Once | ORAL | Status: AC

## 2020-09-27 MED ORDER — POVIDONE-IODINE 10 % EX SWAB
2.0000 "application " | Freq: Once | CUTANEOUS | Status: AC
  Administered 2020-09-28: 2 via TOPICAL

## 2020-09-27 MED ORDER — CHLORHEXIDINE GLUCONATE 0.12 % MT SOLN: 15.0000 mL | Freq: Once | OROMUCOSAL | Status: AC

## 2020-09-27 MED ORDER — CEFAZOLIN SODIUM-DEXTROSE 2-4 GM/100ML-% IV SOLN
2.0000 g | INTRAVENOUS | Status: AC
  Administered 2020-09-28: 2 g via INTRAVENOUS

## 2020-09-28 ENCOUNTER — Encounter
Admission: RE | Disposition: A | Discharge status: Home / Self Care | Payer: Self-pay | Source: Home / Self Care | Attending: Obstetrics and Gynecology

## 2020-09-28 ENCOUNTER — Observation Stay: Payer: No Typology Code available for payment source | Admitting: Anesthesiology

## 2020-09-28 ENCOUNTER — Encounter: Payer: Self-pay | Admitting: Obstetrics and Gynecology

## 2020-09-28 ENCOUNTER — Other Ambulatory Visit: Payer: Self-pay

## 2020-09-28 ENCOUNTER — Observation Stay
Admission: RE | Admit: 2020-09-28 | Discharge: 2020-09-29 | Disposition: A | Discharge status: Home / Self Care | Payer: No Typology Code available for payment source | Attending: Obstetrics and Gynecology | Admitting: Obstetrics and Gynecology

## 2020-09-28 DIAGNOSIS — D259 Leiomyoma of uterus, unspecified: Principal | ICD-10-CM | POA: Insufficient documentation

## 2020-09-28 DIAGNOSIS — Z01810 Encounter for preprocedural cardiovascular examination: Secondary | ICD-10-CM | POA: Diagnosis not present

## 2020-09-28 DIAGNOSIS — I959 Hypotension, unspecified: Secondary | ICD-10-CM

## 2020-09-28 DIAGNOSIS — D72829 Elevated white blood cell count, unspecified: Secondary | ICD-10-CM | POA: Diagnosis not present

## 2020-09-28 DIAGNOSIS — Z90710 Acquired absence of both cervix and uterus: Secondary | ICD-10-CM | POA: Diagnosis not present

## 2020-09-28 DIAGNOSIS — E785 Hyperlipidemia, unspecified: Secondary | ICD-10-CM | POA: Diagnosis not present

## 2020-09-28 DIAGNOSIS — I499 Cardiac arrhythmia, unspecified: Secondary | ICD-10-CM | POA: Diagnosis not present

## 2020-09-28 DIAGNOSIS — R002 Palpitations: Secondary | ICD-10-CM | POA: Diagnosis not present

## 2020-09-28 DIAGNOSIS — Z8616 Personal history of COVID-19: Secondary | ICD-10-CM | POA: Diagnosis not present

## 2020-09-28 DIAGNOSIS — Z148 Genetic carrier of other disease: Secondary | ICD-10-CM | POA: Diagnosis not present

## 2020-09-28 DIAGNOSIS — R9431 Abnormal electrocardiogram [ECG] [EKG]: Secondary | ICD-10-CM | POA: Diagnosis not present

## 2020-09-28 DIAGNOSIS — R42 Dizziness and giddiness: Secondary | ICD-10-CM

## 2020-09-28 DIAGNOSIS — Z1509 Genetic susceptibility to other malignant neoplasm: Secondary | ICD-10-CM | POA: Diagnosis not present

## 2020-09-28 HISTORY — PX: LAPAROSCOPIC VAGINAL HYSTERECTOMY WITH SALPINGECTOMY: SHX6680

## 2020-09-28 HISTORY — DX: Palpitations: R00.2

## 2020-09-28 HISTORY — PX: CYSTOSCOPY: SHX5120

## 2020-09-28 LAB — ELECTROLYTE PANEL
Anion gap: 13 (ref 5–15)
CO2: 20 mmol/L — ABNORMAL LOW (ref 22–32)
Chloride: 104 mmol/L (ref 98–111)
Potassium: 4.1 mmol/L (ref 3.5–5.1)
Sodium: 137 mmol/L (ref 135–145)

## 2020-09-28 LAB — ABO/RH: ABO/RH(D): O POS

## 2020-09-28 LAB — CBC
HCT: 39.1 % (ref 36.0–46.0)
Hemoglobin: 13.6 g/dL (ref 12.0–15.0)
MCH: 31.2 pg (ref 26.0–34.0)
MCHC: 34.8 g/dL (ref 30.0–36.0)
MCV: 89.7 fL (ref 80.0–100.0)
Platelets: 330 10*3/uL (ref 150–400)
RBC: 4.36 MIL/uL (ref 3.87–5.11)
RDW: 11.9 % (ref 11.5–15.5)
WBC: 15.5 10*3/uL — ABNORMAL HIGH (ref 4.0–10.5)
nRBC: 0 % (ref 0.0–0.2)

## 2020-09-28 LAB — TROPONIN I (HIGH SENSITIVITY)
Troponin I (High Sensitivity): <2 ng/L (ref <18)
Troponin I (High Sensitivity): <2 ng/L (ref <18)

## 2020-09-28 LAB — HEMOGLOBIN AND HEMATOCRIT, BLOOD
HCT: 38.3 % (ref 36.0–46.0)
Hemoglobin: 13.4 g/dL (ref 12.0–15.0)

## 2020-09-28 LAB — POCT PREGNANCY, URINE: Preg Test, Ur: NEGATIVE

## 2020-09-28 LAB — D-DIMER, QUANTITATIVE: D-Dimer, Quant: 0.39 ug/mL-FEU (ref 0.00–0.50)

## 2020-09-28 LAB — MAGNESIUM
Magnesium: 2.1 mg/dL (ref 1.7–2.4)
Magnesium: 2 mg/dL (ref 1.7–2.4)

## 2020-09-28 LAB — TSH: TSH: 0.47 u[IU]/mL (ref 0.350–4.500)

## 2020-09-28 SURGERY — HYSTERECTOMY, VAGINAL, LAPAROSCOPY-ASSISTED, WITH SALPINGECTOMY
Anesthesia: General

## 2020-09-28 MED ORDER — FAMOTIDINE 20 MG PO TABS
ORAL_TABLET | ORAL | Status: AC
  Administered 2020-09-28: 20 mg via ORAL
  Filled 2020-09-28: qty 1

## 2020-09-28 MED ORDER — MIDAZOLAM HCL 2 MG/2ML IJ SOLN
INTRAMUSCULAR | Status: DC | PRN
  Administered 2020-09-28: 2 mg via INTRAVENOUS

## 2020-09-28 MED ORDER — PROPOFOL 500 MG/50ML IV EMUL
INTRAVENOUS | Status: DC | PRN
  Administered 2020-09-28: 125 ug/kg/min via INTRAVENOUS

## 2020-09-28 MED ORDER — KETOROLAC TROMETHAMINE 30 MG/ML IJ SOLN
INTRAMUSCULAR | Status: AC
  Filled 2020-09-28: qty 1

## 2020-09-28 MED ORDER — ACETAMINOPHEN 10 MG/ML IV SOLN
INTRAVENOUS | Status: AC
  Filled 2020-09-28: qty 100

## 2020-09-28 MED ORDER — SODIUM CHLORIDE 0.9 % IV SOLN
INTRAVENOUS | Status: DC | PRN
  Administered 2020-09-28: 20 ug/min via INTRAVENOUS

## 2020-09-28 MED ORDER — PROPOFOL 10 MG/ML IV BOLUS
INTRAVENOUS | Status: DC | PRN
  Administered 2020-09-28: 150 mg via INTRAVENOUS

## 2020-09-28 MED ORDER — DEXMEDETOMIDINE (PRECEDEX) IN NS 20 MCG/5ML (4 MCG/ML) IV SYRINGE
PREFILLED_SYRINGE | INTRAVENOUS | Status: DC | PRN
  Administered 2020-09-28: 8 ug via INTRAVENOUS
  Administered 2020-09-28: 4 ug via INTRAVENOUS
  Administered 2020-09-28: 8 ug via INTRAVENOUS

## 2020-09-28 MED ORDER — SODIUM CHLORIDE 0.9 % IV SOLN: 250.0000 mL | INTRAVENOUS | Status: DC | PRN

## 2020-09-28 MED ORDER — ONDANSETRON HCL 4 MG/2ML IJ SOLN
INTRAMUSCULAR | Status: DC | PRN
  Administered 2020-09-28: 4 mg via INTRAVENOUS

## 2020-09-28 MED ORDER — ACETAMINOPHEN 325 MG PO TABS
650.0000 mg | ORAL_TABLET | Freq: Four times a day (QID) | ORAL | Status: DC | PRN
  Administered 2020-09-28 – 2020-09-29 (×2): 650 mg via ORAL
  Filled 2020-09-28: qty 2

## 2020-09-28 MED ORDER — SODIUM CHLORIDE 0.9% FLUSH: 3.0000 mL | INTRAVENOUS | Status: DC | PRN

## 2020-09-28 MED ORDER — POTASSIUM CHLORIDE 20 MEQ PO PACK
40.0000 meq | PACK | Freq: Two times a day (BID) | ORAL | Status: DC
  Administered 2020-09-28: 40 meq via ORAL
  Filled 2020-09-28 (×4): qty 2

## 2020-09-28 MED ORDER — FENTANYL CITRATE (PF) 100 MCG/2ML IJ SOLN
INTRAMUSCULAR | Status: AC
  Filled 2020-09-28: qty 2

## 2020-09-28 MED ORDER — ROCURONIUM BROMIDE 100 MG/10ML IV SOLN
INTRAVENOUS | Status: DC | PRN
  Administered 2020-09-28: 20 mg via INTRAVENOUS
  Administered 2020-09-28: 50 mg via INTRAVENOUS
  Administered 2020-09-28: 10 mg via INTRAVENOUS

## 2020-09-28 MED ORDER — DEXAMETHASONE SODIUM PHOSPHATE 10 MG/ML IJ SOLN
INTRAMUSCULAR | Status: AC
  Filled 2020-09-28: qty 1

## 2020-09-28 MED ORDER — SODIUM CHLORIDE 0.9% FLUSH
3.0000 mL | Freq: Two times a day (BID) | INTRAVENOUS | Status: DC
  Administered 2020-09-28: 3 mL via INTRAVENOUS

## 2020-09-28 MED ORDER — ACETAMINOPHEN 10 MG/ML IV SOLN
INTRAVENOUS | Status: DC | PRN
  Administered 2020-09-28: 1000 mg via INTRAVENOUS

## 2020-09-28 MED ORDER — PROPOFOL 500 MG/50ML IV EMUL
INTRAVENOUS | Status: AC
  Filled 2020-09-28: qty 50

## 2020-09-28 MED ORDER — BUPIVACAINE HCL (PF) 0.5 % IJ SOLN
INTRAMUSCULAR | Status: AC
  Filled 2020-09-28: qty 30

## 2020-09-28 MED ORDER — CEFAZOLIN SODIUM-DEXTROSE 2-4 GM/100ML-% IV SOLN
INTRAVENOUS | Status: AC
  Filled 2020-09-28: qty 100

## 2020-09-28 MED ORDER — PROPOFOL 10 MG/ML IV BOLUS
INTRAVENOUS | Status: AC
  Filled 2020-09-28: qty 20

## 2020-09-28 MED ORDER — KETOROLAC TROMETHAMINE 30 MG/ML IJ SOLN
INTRAMUSCULAR | Status: DC | PRN
  Administered 2020-09-28: 30 mg via INTRAVENOUS

## 2020-09-28 MED ORDER — KETAMINE HCL 50 MG/5ML IJ SOSY
PREFILLED_SYRINGE | INTRAMUSCULAR | Status: AC
  Filled 2020-09-28: qty 5

## 2020-09-28 MED ORDER — MENTHOL 3 MG MT LOZG
1.0000 | LOZENGE | OROMUCOSAL | Status: DC | PRN
  Filled 2020-09-28: qty 9

## 2020-09-28 MED ORDER — OXYCODONE-ACETAMINOPHEN 5-325 MG PO TABS
1.0000 | ORAL_TABLET | ORAL | Status: DC | PRN
  Administered 2020-09-28 – 2020-09-29 (×2): 1 via ORAL
  Filled 2020-09-28 (×2): qty 1

## 2020-09-28 MED ORDER — DEXMEDETOMIDINE (PRECEDEX) IN NS 20 MCG/5ML (4 MCG/ML) IV SYRINGE
PREFILLED_SYRINGE | INTRAVENOUS | Status: AC
  Filled 2020-09-28: qty 5

## 2020-09-28 MED ORDER — SODIUM CHLORIDE 0.9 % IV SOLN: INTRAVENOUS | Status: DC

## 2020-09-28 MED ORDER — MIDAZOLAM HCL 2 MG/2ML IJ SOLN
INTRAMUSCULAR | Status: AC
  Filled 2020-09-28: qty 2

## 2020-09-28 MED ORDER — OXYCODONE HCL 5 MG PO TABS: 5.0000 mg | ORAL_TABLET | Freq: Once | ORAL | Status: DC | PRN

## 2020-09-28 MED ORDER — FENTANYL CITRATE (PF) 100 MCG/2ML IJ SOLN
INTRAMUSCULAR | Status: DC | PRN
  Administered 2020-09-28 (×2): 50 ug via INTRAVENOUS

## 2020-09-28 MED ORDER — EPHEDRINE 5 MG/ML INJ
INTRAVENOUS | Status: AC
  Filled 2020-09-28: qty 10

## 2020-09-28 MED ORDER — GLYCOPYRROLATE 0.2 MG/ML IJ SOLN
INTRAMUSCULAR | Status: DC | PRN
  Administered 2020-09-28: .2 mg via INTRAVENOUS

## 2020-09-28 MED ORDER — KETOROLAC TROMETHAMINE 30 MG/ML IJ SOLN
30.0000 mg | Freq: Four times a day (QID) | INTRAMUSCULAR | Status: AC | PRN
  Administered 2020-09-28: 30 mg via INTRAVENOUS
  Filled 2020-09-28: qty 1

## 2020-09-28 MED ORDER — DEXAMETHASONE SODIUM PHOSPHATE 10 MG/ML IJ SOLN
INTRAMUSCULAR | Status: DC | PRN
  Administered 2020-09-28: 10 mg via INTRAVENOUS

## 2020-09-28 MED ORDER — PROPOFOL 500 MG/50ML IV EMUL
INTRAVENOUS | Status: AC
  Filled 2020-09-28: qty 100

## 2020-09-28 MED ORDER — PROMETHAZINE HCL 25 MG/ML IJ SOLN
6.2500 mg | INTRAMUSCULAR | Status: DC | PRN
Start: 2020-09-28 — End: 2020-09-28

## 2020-09-28 MED ORDER — OXYCODONE HCL 5 MG/5ML PO SOLN: 5.0000 mg | Freq: Once | ORAL | Status: DC | PRN

## 2020-09-28 MED ORDER — DEXTROSE-NACL 5-0.45 % IV SOLN: INTRAVENOUS | Status: DC

## 2020-09-28 MED ORDER — MORPHINE SULFATE (PF) 2 MG/ML IV SOLN
1.0000 mg | INTRAVENOUS | Status: DC | PRN
  Administered 2020-09-28: 1 mg via INTRAVENOUS
  Filled 2020-09-28: qty 1

## 2020-09-28 MED ORDER — SUGAMMADEX SODIUM 200 MG/2ML IV SOLN
INTRAVENOUS | Status: DC | PRN
  Administered 2020-09-28: 200 mg via INTRAVENOUS

## 2020-09-28 MED ORDER — KETAMINE HCL 10 MG/ML IJ SOLN
INTRAMUSCULAR | Status: DC | PRN
  Administered 2020-09-28: 20 mg via INTRAVENOUS

## 2020-09-28 MED ORDER — LIDOCAINE HCL (PF) 2 % IJ SOLN
INTRAMUSCULAR | Status: AC
  Filled 2020-09-28: qty 5

## 2020-09-28 MED ORDER — CHLORHEXIDINE GLUCONATE 0.12 % MT SOLN
OROMUCOSAL | Status: AC
  Administered 2020-09-28: 15 mL via OROMUCOSAL
  Filled 2020-09-28: qty 15

## 2020-09-28 MED ORDER — BUPIVACAINE HCL 0.5 % IJ SOLN
INTRAMUSCULAR | Status: DC | PRN
  Administered 2020-09-28: 14 mL

## 2020-09-28 MED ORDER — ONDANSETRON HCL 4 MG/2ML IJ SOLN: 4.0000 mg | Freq: Four times a day (QID) | INTRAMUSCULAR | Status: DC | PRN

## 2020-09-28 MED ORDER — FENTANYL CITRATE (PF) 100 MCG/2ML IJ SOLN
25.0000 ug | INTRAMUSCULAR | Status: DC | PRN
  Administered 2020-09-28: 50 ug via INTRAVENOUS
  Administered 2020-09-28: 25 ug via INTRAVENOUS

## 2020-09-28 MED ORDER — ROCURONIUM BROMIDE 10 MG/ML (PF) SYRINGE
PREFILLED_SYRINGE | INTRAVENOUS | Status: AC
  Filled 2020-09-28: qty 10

## 2020-09-28 MED ORDER — ONDANSETRON HCL 4 MG/2ML IJ SOLN
INTRAMUSCULAR | Status: AC
  Filled 2020-09-28: qty 2

## 2020-09-28 MED ORDER — ONDANSETRON HCL 4 MG PO TABS: 4.0000 mg | ORAL_TABLET | Freq: Four times a day (QID) | ORAL | Status: DC | PRN

## 2020-09-28 MED ORDER — SUCCINYLCHOLINE CHLORIDE 200 MG/10ML IV SOSY
PREFILLED_SYRINGE | INTRAVENOUS | Status: AC
  Filled 2020-09-28: qty 10

## 2020-09-28 MED ORDER — FENTANYL CITRATE (PF) 100 MCG/2ML IJ SOLN
INTRAMUSCULAR | Status: AC
  Administered 2020-09-28: 25 ug via INTRAVENOUS
  Filled 2020-09-28: qty 2

## 2020-09-28 MED ORDER — ONDANSETRON HCL 4 MG/2ML IJ SOLN
INTRAMUSCULAR | Status: AC
  Administered 2020-09-28: 4 mg
  Filled 2020-09-28: qty 2

## 2020-09-28 SURGICAL SUPPLY — 50 items
ANCHOR TIS RET SYS 235ML (MISCELLANEOUS) IMPLANT
APPLICATOR ARISTA FLEXITIP XL (MISCELLANEOUS) ×1 IMPLANT
BAG URINE DRAIN 2000ML AR STRL (UROLOGICAL SUPPLIES) ×3 IMPLANT
BLADE SURG SZ11 CARB STEEL (BLADE) ×3 IMPLANT
CATH FOLEY 2WAY  5CC 16FR (CATHETERS) ×1
CATH URTH 16FR FL 2W BLN LF (CATHETERS) ×2 IMPLANT
CHLORAPREP W/TINT 26 (MISCELLANEOUS) ×3 IMPLANT
COVER WAND RF STERILE (DRAPES) ×3 IMPLANT
DEFOGGER SCOPE WARMER CLEARIFY (MISCELLANEOUS) ×1 IMPLANT
DERMABOND ADVANCED (GAUZE/BANDAGES/DRESSINGS) ×1
DERMABOND ADVANCED .7 DNX12 (GAUZE/BANDAGES/DRESSINGS) ×2 IMPLANT
DEVICE SUTURE ENDOST 10MM (ENDOMECHANICALS) ×1 IMPLANT
GAUZE 4X4 16PLY RFD (DISPOSABLE) ×3 IMPLANT
GLOVE SURG ENC MOIS LTX SZ7 (GLOVE) ×3 IMPLANT
GLOVE SURG UNDER LTX SZ7.5 (GLOVE) ×3 IMPLANT
GOWN STRL REUS W/ TWL LRG LVL3 (GOWN DISPOSABLE) ×4 IMPLANT
GOWN STRL REUS W/ TWL XL LVL3 (GOWN DISPOSABLE) IMPLANT
GOWN STRL REUS W/TWL LRG LVL3 (GOWN DISPOSABLE) ×2
GOWN STRL REUS W/TWL XL LVL3 (GOWN DISPOSABLE)
GRASPER SUT TROCAR 14GX15 (MISCELLANEOUS) IMPLANT
HEMOSTAT ARISTA ABSORB 3G PWDR (HEMOSTASIS) ×1 IMPLANT
IRRIGATION STRYKERFLOW (MISCELLANEOUS) IMPLANT
IRRIGATOR STRYKERFLOW (MISCELLANEOUS) ×3
IV LACTATED RINGERS 1000ML (IV SOLUTION) ×3 IMPLANT
IV NS 1000ML (IV SOLUTION) ×1
IV NS 1000ML BAXH (IV SOLUTION) IMPLANT
KIT PINK PAD W/HEAD ARE REST (MISCELLANEOUS) ×3
KIT PINK PAD W/HEAD ARM REST (MISCELLANEOUS) ×2 IMPLANT
KIT TURNOVER CYSTO (KITS) ×3 IMPLANT
LABEL OR SOLS (LABEL) ×3 IMPLANT
MANIFOLD NEPTUNE II (INSTRUMENTS) ×3 IMPLANT
MANIPULATOR VCARE LG CRV RETR (MISCELLANEOUS) ×1 IMPLANT
NS IRRIG 1000ML POUR BTL (IV SOLUTION) ×3 IMPLANT
NS IRRIG 500ML POUR BTL (IV SOLUTION) ×3 IMPLANT
OCCLUDER COLPOPNEUMO (BALLOONS) ×1 IMPLANT
PACK GYN LAPAROSCOPIC (MISCELLANEOUS) ×3 IMPLANT
PAD OB MATERNITY 4.3X12.25 (PERSONAL CARE ITEMS) ×3 IMPLANT
PAD PREP 24X41 OB/GYN DISP (PERSONAL CARE ITEMS) ×3 IMPLANT
SCISSORS METZENBAUM CVD 33 (INSTRUMENTS) ×1 IMPLANT
SET CYSTO W/LG BORE CLAMP LF (SET/KITS/TRAYS/PACK) ×3 IMPLANT
SET TUBE SMOKE EVAC HIGH FLOW (TUBING) ×3 IMPLANT
SHEARS HARMONIC ACE PLUS 36CM (ENDOMECHANICALS) ×1 IMPLANT
SLEEVE ENDOPATH XCEL 5M (ENDOMECHANICALS) ×6 IMPLANT
SURGILUBE 2OZ TUBE FLIPTOP (MISCELLANEOUS) ×3 IMPLANT
SUT ENDO VLOC 180-0-8IN (SUTURE) ×1 IMPLANT
SUT MNCRL AB 4-0 PS2 18 (SUTURE) ×3 IMPLANT
SUT VIC AB 0 SH 27 (SUTURE) ×1 IMPLANT
SUT VIC AB 2-0 UR6 27 (SUTURE) ×3 IMPLANT
TROCAR ENDO BLADELESS 11MM (ENDOMECHANICALS) IMPLANT
TROCAR XCEL NON-BLD 5MMX100MML (ENDOMECHANICALS) ×3 IMPLANT

## 2020-09-28 NOTE — Anesthesia Preprocedure Evaluation (Addendum)
Anesthesia Evaluation  Patient identified by MRN, date of birth, ID band Patient awake    Reviewed: Allergy & Precautions, H&P , NPO status , Patient's Chart, lab work & pertinent test results  History of Anesthesia Complications (+) PONVNegative for: history of anesthetic complications  Airway Mallampati: I  TM Distance: >3 FB Neck ROM: full    Dental  (+) Teeth Intact   Pulmonary neg pulmonary ROS, neg sleep apnea, neg COPD,    breath sounds clear to auscultation       Cardiovascular (-) angina(-) Past MI and (-) Cardiac Stents negative cardio ROS  (-) dysrhythmias  Rhythm:regular Rate:Normal     Neuro/Psych negative neurological ROS  negative psych ROS   GI/Hepatic negative GI ROS, Neg liver ROS,   Endo/Other  negative endocrine ROS  Renal/GU      Musculoskeletal   Abdominal   Peds  Hematology negative hematology ROS (+)   Anesthesia Other Findings Past Medical History: 05/06/2020: COVID-19 04/2020: Family history of breast cancer     Comment:  IBIS=18.2%/riskscore=16.5% No date: Family history of Lynch syndrome No date: Hypercholesterolemia No date: Motion sickness     Comment:  cars 04/2020: PMS2-related Lynch syndrome (HNPCC4)     Comment:  [facility] MyRisk No date: Pneumonia     Comment:  as a child No date: PONV (postoperative nausea and vomiting) No date: Wears contact lenses  Past Surgical History: 11/2019: APPENDECTOMY 08/16/2020: BIOPSY; N/A     Comment:  Procedure: BIOPSY;  Surgeon: Lucilla Lame, MD;                Location: [place];  Service: Endoscopy;                Laterality: N/A; 08/16/2020: COLONOSCOPY WITH PROPOFOL; N/A     Comment:  Procedure: COLONOSCOPY WITH PROPOFOL;  Surgeon: Lucilla Lame, MD;  Location: [place];  Service:               Endoscopy;  Laterality: N/A;  priority 4 08/16/2020: ESOPHAGOGASTRODUODENOSCOPY; N/A     Comment:   Procedure: ESOPHAGOGASTRODUODENOSCOPY (EGD);  Surgeon:               Lucilla Lame, MD;  Location: [place];                Service: Endoscopy;  Laterality: N/A;  BMI    Body Mass Index: 29.29 kg/m      Reproductive/Obstetrics negative OB ROS                            Anesthesia Physical Anesthesia Plan  ASA: I  Anesthesia Plan: General ETT   Post-op Pain Management:    Induction:   PONV Risk Score and Plan: 4 or greater and Ondansetron, Dexamethasone, Midazolam, Treatment may vary due to age or medical condition, Propofol infusion and TIVA  Airway Management Planned:   Additional Equipment:   Intra-op Plan:   Post-operative Plan:   Informed Consent: I have reviewed the patients History and Physical, chart, labs and discussed the procedure including the risks, benefits and alternatives for the proposed anesthesia with the patient or authorized representative who has indicated his/her understanding and acceptance.     Dental Advisory Given  Plan Discussed with: Anesthesiologist, CRNA and Surgeon  Anesthesia Plan Comments:        Anesthesia Quick Evaluation

## 2020-09-28 NOTE — Transfer of Care (Signed)
Immediate Anesthesia Transfer of Care Note  Patient: Rhonda Shaw  Procedure(s) Performed: LAPAROSCOPIC ASSISTED VAGINAL HYSTERECTOMY WITH BILATERAL SALPINGECTOMY (Bilateral ) CYSTOSCOPY (N/A )  Patient Location: PACU  Anesthesia Type:General  Level of Consciousness: sedated  Airway & Oxygen Therapy: Patient Spontanous Breathing and Patient connected to face mask oxygen  Post-op Assessment: Report given to RN and Post -op Vital signs reviewed and stable  Post vital signs: Reviewed and stable  Last Vitals:  Vitals Value Taken Time  BP 108/69 09/28/20 1120  Temp    Pulse 71 09/28/20 1121  Resp 11 09/28/20 1121  SpO2 100 % 09/28/20 1121  Vitals shown include unvalidated device data.  Last Pain:  Vitals:   09/28/20 1120  TempSrc:   PainSc: 0-No pain         Complications: No complications documented.

## 2020-09-28 NOTE — Progress Notes (Signed)
Pt reports feeling heart palpitations and flushing of face and neck during episodes. Denies dizziness, pain, nausea or other symptoms during episodes. Had some initial weakness and dizziness when up to bathroom after removing foley cath around 1330, but stated that feels better now. RN in room for episode of Paged attending MD and anesthesia. Awaiting callback from attending. Dr. Ronelle Nigh (anesthesia) authorized RN to order EKG, but referred to attending for further workup. Order placed for EKG stat.

## 2020-09-28 NOTE — Progress Notes (Signed)
   Subjective: Patient reports palpitation intermittently.  No chest pain, no nausea, no shortness of breath.  Pain well controlled.  Tolerating po.    Objective: Vital signs in last 24 hours: Temp:  [97.9 F (36.6 C)-98.6 F (37 C)] 98.2 F (36.8 C) (04/26 1644) Pulse Rate:  [69-98] 88 (04/26 1644) Resp:  [11-20] 20 (04/26 1644) BP: (99-133)/(69-86) 133/86 (04/26 1644) SpO2:  [94 %-100 %] 97 % (04/26 1644) Weight:  [68 kg] 68 kg (04/26 0733)    Intake/Output from previous day: No intake/output data recorded.  Physical Examination: OBGyn Exam  Labs: Results for orders placed or performed during the hospital encounter of 09/28/20 (from the past 72 hour(s))  Pregnancy, urine POC     Status: None   Collection Time: 09/28/20  7:12 AM  Result Value Ref Range   Preg Test, Ur NEGATIVE NEGATIVE    Comment:        THE SENSITIVITY OF THIS METHODOLOGY IS >24 mIU/mL   ABO/Rh     Status: None   Collection Time: 09/28/20  7:43 AM  Result Value Ref Range   ABO/RH(D)      O POS Performed at [facility], [address], [place], [place] [address]      Assessment:  40 y.o. s/p Day of Surgery Procedure(s) (LRB): LAPAROSCOPIC ASSISTED TOTAL HYSTERECTOMY WITH BILATERAL SALPINGECTOMY (Bilateral) CYSTOSCOPY (N/A) : stable  Plan: 1) Pain: well controlled on current medication management  2) Heme:  - hemodynamically stable  3) FEN: Advance diet, saline lock  4) Prophylaxis: intermittent pneumatic compression boots.  5) Palpitations:  - prolonged QT and PAC - CBC, d-dimer, troponin pending - Will obtain medicine referral   6) Disposition: The patient is to be discharged to home.  [name], MD, [facility], [facility] 09/28/2020, 7:16 PM

## 2020-09-28 NOTE — Anesthesia Procedure Notes (Signed)
Procedure Name: Intubation Date/Time: 09/28/2020 9:20 AM Performed by: Joellyn Quails, RN Pre-anesthesia Checklist: Patient identified, Patient being monitored, Timeout performed, Emergency Drugs available and Suction available Patient Re-evaluated:Patient Re-evaluated prior to induction Oxygen Delivery Method: Circle system utilized Preoxygenation: Pre-oxygenation with 100% oxygen Induction Type: IV induction Ventilation: Mask ventilation without difficulty Laryngoscope Size: 3 and McGraph Grade View: Grade I Tube type: Oral Tube size: 7.0 mm Number of attempts: 1 Airway Equipment and Method: Stylet and Video-laryngoscopy Placement Confirmation: ETT inserted through vocal cords under direct vision,  positive ETCO2 and breath sounds checked- equal and bilateral Secured at: 21 cm Tube secured with: Tape Dental Injury: Teeth and Oropharynx as per pre-operative assessment

## 2020-09-28 NOTE — Interval H&P Note (Signed)
History and Physical Interval Note:  09/28/2020 8:40 AM  Rhonda Shaw  has presented today for surgery, with the diagnosis of PMS2 mutation carrier/lynch syndrome.  The various methods of treatment have been discussed with the patient and family. After consideration of risks, benefits and other options for treatment, the patient has consented to  Procedure(s): LAPAROSCOPIC ASSISTED VAGINAL HYSTERECTOMY WITH BILATERAL SALPINGECTOMY (Bilateral) CYSTOSCOPY (N/A) as a surgical intervention.  The patient's history has been reviewed, patient examined, no change in status, stable for surgery.  I have reviewed the patient's chart and labs.  Questions were answered to the patient's satisfaction.     [name]

## 2020-09-28 NOTE — Op Note (Signed)
Preoperative Diagnosis: 1) 40 y.o. with lynch syndrome  Postoperative Diagnosis: 1) 40 y.o. with lynch syndrome  Operation Performed: Total laparoscopic hysterectomy, bilateral salpingectomy, and cystoscopy  Indication:  PMS-2 related lynch syndrome, after counseling patient requested risk reducing hysterectomy.  Surgeon: [name], MD  Assistant: Yvette Rack, MD this surgery required a high level surgical assistant with none other readily available  Anesthesia: General  Preoperative Antibiotics: 2g Ancef  Estimated Blood Loss: 100 mL  IV Fluids: 753m  Urine Output:: 5041m Drains or Tubes: none  Implants: none  Specimens Removed: Uterus, cervix, and bilateral fallopian tubes  Complications: none  Intraoperative Findings: Normal tubes, ovaries, and uterus.  There was a small endometriotic implant noted on the right uterosacral ligament.    Patient Condition: stable  Procedure in Detail:  Patient was taken to the operating room where she was administered general anesthesia.  She was positioned in the dorsal lithotomy position utilizing Allen stirups, prepped and draped in the usual sterile fashion.  Prior to proceeding with procedure a time out was performed.  Attention was turned to the patient's pelvis.  An indwelling foley catheter was placed to decompress the patient's bladder.  An operative speculum was placed to allow visualization of the cervix.  The anterior lip of the cervix was grasped with a single tooth tenaculum, and a large V-care uterine manipulator was placed to allow manipulation of the uterus.  The operative speculum and single tooth tenaculum were then removed.  Attention was turned to the patient's abdomen.  The umbilicus was infiltrated with 1% Sensorcaine, before making a stab incision using an 11 blade scalpel.  A [id number] trocar was then used to gain direct entry into the peritoneal cavity utilizing the camera to visualize progress of the  trocar during placement.  Once peritoneal entry had been achieved, insufflation was started and pneumoperitoneum established at a pressure of 167m67m    One left and one right lower quadrant site were then injected with 1% Sensorcaine and a stab incision was made using an 11 blade scalpel.  Two additional 67mm 51mel trocars were placed through these incisions under direct visualization. The umbilical trocar was stepped up to an 11mm 67ml trocar.  General inspection of the abdomen revealed the above noted findings.   The right tube was identified and grasped at its fimbriated end.  The tube was transected from its attachments to the ovary and mesosalpinx using a 67mm Ha42mnic scalpel by Dr. SchumanGilman Schmidtutero ovarian ligament was identified ligated and transected using the Harmonic scalpel. The round ligament was then likewise ligated and transected.  The anterior leaf of the broad ligament was dissected down to the level of the internal cervical os and a bladder flap was started.  The posterior leaf of the broad ligament was dissected down to the utero-sacral ligament.  The uterine artery was skeletonized before being ligated and transected using the Harmonic scalpel with cephelad pressure applied to the V-care device to assure lateralization of the ureter.  A bite was then taken with Harmonic medial to transected portio of uterine artery to further lateralize the ureter and vessel off the V-care cup.  The patient left adnexal structures were then dissected in similar fashion by myself.  The bladder flap was completed and the bladder mobilized off the V-care cup.  An anterior colpotomy was scores and carried around in a clockwise fashion to free the specimen, which was then removed vaginally.  Inspection revealed all pedicles to be hemostatic before  proceed with vaginal closure.  The vaginal cuff was closed using a running 0 V-loc suture and endostitch ensuring good mucosal bites.  The cuff was well approximated  and hemostatic following closure..  Additional hemostatic agents, Arista, was applied to all pedicles.    The 77m Trocar site was closed using a Carter-Thompson and 0 Vicryl under direct visualization.  Pneumoperitoneum was evacuated and trocars were removed.  All trocar sites were closed with 4-0 Monocryl then dressed with surgical skin glue.   The indwelling foley catheter was removed.  Cystoscopy was performed noting and intact bladder dome as well as brisk efflux of urine from bother ureteral orifices.  The cystoscopy was removed and the indwelling foley catheter was replaced.   Sponge needle and instrument counts were correct time two.  The patient tolerated the procedure well and was taken to the recovery room in stable condition.

## 2020-09-28 NOTE — H&P (Addendum)
[facility]     [place]                                                      Consult note  PATIENT NAME: Rhonda Shaw    MR#:  [id number]  DATE OF BIRTH:  [birth year]  DATE OF ADMISSION:  09/28/2020  PRIMARY CARE PHYSICIAN: [place]   Patient is coming from: Home.  REQUESTING/REFERRING PHYSICIAN: Merri Brunette, MD  CHIEF COMPLAINT:  Feeling flushed Arrhythmia  HISTORY OF PRESENT ILLNESS:  Rhonda Shaw is a 40 y.o. G73P2002 female with medical history significant for COVID-19 and Lynch syndrome status post laparoscopic-assisted vaginal hysterectomy with bilateral salpingectomy and cystoscopy today, who experienced an episode of feeling flushed with palpitations and feeling her heart fluttering.  She had a brief episode of hypotension that responded to IV fluids.  Her EKGs showed PVCs.  She stated that she was getting up out of her bed and then came back and stayed for a while before she had this feeling.  She denied any chest pain, dyspnea or cough or wheezing or hemoptysis.  No fever or chills.  She had nausea for which she was given Zofran earlier after her surgery.  No dysuria, oliguria or hematuria or flank pain.  She has mild expected postoperative abdominal pain.  Perioperative and postoperative course: The patient had a brief drop of her blood pressure to 99/70 with a heart rate of 98 and has been maintaining her pulse oximetry.  Her latest blood pressure was 151/87 with a heart rate of 109 and respiratory rate of 16 with a pulse oximetry of 99% on room air.  Labs so far revealed a CBC with WBC 15.5 and was otherwise unremarkable.  Earlier labs 4 days ago revealed normal CBC and BMP with a potassium of 3.8.  Today urine pregnancy test was negative.  COVID-19 PCR came back -4 days ago.  EKG as reviewed by me : Initial EKG showed normal sinus rhythm with a rate of 96 with minimal voltage criteria for LVH and prolonged QT interval with QTC of 530 MS.  Repeat EKG  showed sinus rhythm with PACs with aberrant conduction and minimal voltage criteria for LVH with prolonged QT and overall with a QTC of 493 MS.  I was consulted for medical evaluation and management. PAST MEDICAL HISTORY:   Past Medical History:  Diagnosis Date  . COVID-19 05/06/2020  . Family history of breast cancer 04/2020   IBIS=18.2%/riskscore=16.5%  . Family history of Lynch syndrome   . Hypercholesterolemia   . Motion sickness    cars  . PMS2-related Lynch syndrome (HNPCC4) 04/2020   [facility] MyRisk  . Pneumonia    as a child  . PONV (postoperative nausea and vomiting)   . Wears contact lenses     PAST SURGICAL HISTORY:   Past Surgical History:  Procedure Laterality Date  . APPENDECTOMY  11/2019  . BIOPSY N/A 08/16/2020   Procedure: BIOPSY;  Surgeon: Lucilla Lame, MD;  Location: [facility];  Service: Endoscopy;  Laterality: N/A;  . COLONOSCOPY WITH PROPOFOL N/A 08/16/2020   Procedure: COLONOSCOPY WITH PROPOFOL;  Surgeon: Lucilla Lame, MD;  Location: [facility];  Service: Endoscopy;  Laterality: N/A;  priority 4  . ESOPHAGOGASTRODUODENOSCOPY N/A 08/16/2020   Procedure: ESOPHAGOGASTRODUODENOSCOPY (EGD);  Surgeon: Lucilla Lame, MD;  Location: [facility];  Service: Endoscopy;  Laterality: N/A;  Laparoscopic vaginal hysterectomy and bilateral salpingectomy as well as cystoscopy.  SOCIAL HISTORY:   Social History   Tobacco Use  . Smoking status: Never Smoker  . Smokeless tobacco: Never Used  Substance Use Topics  . Alcohol use: Yes    Comment: rarely    FAMILY HISTORY:   Family History  Problem Relation Age of Onset  . Breast cancer Paternal Aunt 46       triple neg; Lynch syndrome  . Colon cancer Paternal Aunt 58  . Prostate cancer Paternal Great-grandfather        12s  . Other Sister 22       Lynch Syndrome Positive  . Breast cancer Paternal Aunt        not sure of age  . Prostate cancer Paternal Grandfather 61  Positive for  diabetes mellitus, hypertension, dyslipidemia and hypothyroidism.  DRUG ALLERGIES:   Allergies  Allergen Reactions  . Sulfa Antibiotics Hives    REVIEW OF SYSTEMS:   ROS As per history of present illness. All pertinent systems were reviewed above. Constitutional, HEENT, cardiovascular, respiratory, GI, GU, musculoskeletal, neuro, psychiatric, endocrine, integumentary and hematologic systems were reviewed and are otherwise negative/unremarkable except for positive findings mentioned above in the HPI.   MEDICATIONS AT HOME:   Prior to Admission medications   Medication Sig Start Date End Date Taking? Authorizing Provider  Multiple Vitamin (MULTIVITAMINS PO) Take 1 tablet by mouth daily.   Yes [provider]      VITAL SIGNS:  Blood pressure (!) 151/87, pulse (!) 109, temperature 98.6 F (37 C), temperature source Oral, resp. rate 16, height 5' (1.524 m), weight 68 kg, last menstrual period 09/17/2020, SpO2 99 %.  PHYSICAL EXAMINATION:  Physical Exam  GENERAL:  40 y.o.-year-old Caucasian female patient lying in the bed with no acute distress.  EYES: Pupils equal, round, reactive to light and accommodation. No scleral icterus. Extraocular muscles intact.  HEENT: Head atraumatic, normocephalic. Oropharynx and nasopharynx clear.  NECK:  Supple, no jugular venous distention. No thyroid enlargement, no tenderness.  LUNGS: Normal breath sounds bilaterally, no wheezing, rales,rhonchi or crepitation. No use of accessory muscles of respiration.  CARDIOVASCULAR: Regular rate and rhythm, S1, S2 normal. No murmurs, rubs, or gallops.  ABDOMEN: Soft, nondistended, with mild expected postoperative tenderness. Bowel sounds present. No organomegaly or mass.  EXTREMITIES: No pedal edema, cyanosis, or clubbing.  NEUROLOGIC: Cranial nerves II through XII are intact. Muscle strength 5/5 in all extremities. Sensation intact. Gait not checked.  PSYCHIATRIC: The patient is alert and oriented x  3.  Normal affect and good eye contact. SKIN: No obvious rash, lesion, or ulcer.   LABORATORY PANEL:   CBC Recent Labs  Lab 09/28/20 1901  WBC 15.5*  HGB 13.6  HCT 39.1  PLT 330   ------------------------------------------------------------------------------------------------------------------  Chemistries  Recent Labs  Lab 09/24/20 0935  NA 141  K 3.8  CL 107  CO2 24  GLUCOSE 91  BUN 9  CREATININE 0.65  CALCIUM 9.3   ------------------------------------------------------------------------------------------------------------------  Cardiac Enzymes No results for input(s): TROPONINI in the last 168 hours. ------------------------------------------------------------------------------------------------------------------  RADIOLOGY:  No results found.    IMPRESSION AND PLAN:  Active Problems:   PMS2-related Lynch syndrome (HNPCC4)   S/P laparoscopic hysterectomy 1.  Palpitations with abnormal EKG with PVCs and episode of hypotension, status post laparoscopic assisted vaginal hysterectomy, bilateral salpingectomy and cystoscopy. - The patient was placed on telemetry. - We will follow  serial troponin I's. - We obtained a TSH level that came back normal as well as a D-dimer that came back negative. - We will optimize her potassium.  She was given 40 mill equivalent p.o. potassium chloride. - Magnesium level came back 2.1. -I ordered serial hemoglobin hematocrits.  They are stable so far. - Cardiology consult will be obtained. - Notify Dr. Quentin Ore about patient.  2.  Dyslipidemia. - We will check her fasting lipids in a.m.  3.  Leukocytosis. - This likely secondary to stress demargination. - We will obtain a UA and monitor CBC.  DVT prophylaxis: Lovenox. Code Status: full code. Family Communication:  The plan of care was discussed in details with the patient (and her family including her husband, mother and sister who are with her in the room). I answered all  questions. The patient agreed to proceed with the above mentioned plan. Further management will depend upon hospital course. Disposition Plan: Back to previous home environment  All the records are reviewed and case discussed with the consulting physician.  Status is: Observation  The patient remains OBS appropriate and will d/c before 2 midnights.  Dispo: The patient is from: Home              Anticipated d/c is to: Home              Patient currently is not medically stable to d/c.   Difficult to place patient No  Thank you Dr. Gilman Schmidt for allowing me to participate in the care of this very pleasant lady.  We will follow the patient along with you.  TOTAL TIME TAKING CARE OF THIS PATIENT: 55 minutes.    Christel Mormon M.D on 09/28/2020 at 7:35 PM  [facility]   From 7 PM-7 AM, contact night-coverage [email or web address]  CC: Primary care physician; [place]

## 2020-09-29 ENCOUNTER — Observation Stay (HOSPITAL_BASED_OUTPATIENT_CLINIC_OR_DEPARTMENT_OTHER)
Admission: RE | Admit: 2020-09-29 | Discharge: 2020-09-29 | Disposition: A | Discharge status: Home / Self Care | Payer: No Typology Code available for payment source | Source: Home / Self Care | Attending: Physician Assistant | Admitting: Physician Assistant

## 2020-09-29 ENCOUNTER — Telehealth: Payer: Self-pay | Admitting: Cardiology

## 2020-09-29 ENCOUNTER — Other Ambulatory Visit: Payer: Self-pay | Admitting: Physician Assistant

## 2020-09-29 ENCOUNTER — Encounter: Payer: Self-pay | Admitting: Obstetrics and Gynecology

## 2020-09-29 ENCOUNTER — Observation Stay
Admission: RE | Admit: 2020-09-29 | Payer: No Typology Code available for payment source | Source: Home / Self Care | Admitting: Obstetrics and Gynecology

## 2020-09-29 DIAGNOSIS — R9431 Abnormal electrocardiogram [ECG] [EKG]: Secondary | ICD-10-CM

## 2020-09-29 DIAGNOSIS — R42 Dizziness and giddiness: Secondary | ICD-10-CM | POA: Diagnosis not present

## 2020-09-29 DIAGNOSIS — Z90710 Acquired absence of both cervix and uterus: Secondary | ICD-10-CM | POA: Diagnosis not present

## 2020-09-29 DIAGNOSIS — D259 Leiomyoma of uterus, unspecified: Secondary | ICD-10-CM | POA: Diagnosis not present

## 2020-09-29 DIAGNOSIS — R002 Palpitations: Secondary | ICD-10-CM | POA: Diagnosis not present

## 2020-09-29 DIAGNOSIS — R011 Cardiac murmur, unspecified: Secondary | ICD-10-CM

## 2020-09-29 DIAGNOSIS — Z1509 Genetic susceptibility to other malignant neoplasm: Secondary | ICD-10-CM | POA: Diagnosis not present

## 2020-09-29 LAB — LIPID PANEL
Cholesterol: 286 mg/dL — ABNORMAL HIGH (ref 0–200)
HDL: 55 mg/dL (ref >40)
LDL Cholesterol: 193 mg/dL — ABNORMAL HIGH (ref 0–99)
Total CHOL/HDL Ratio: 5.2 RATIO
Triglycerides: 191 mg/dL — ABNORMAL HIGH (ref <150)
VLDL: 38 mg/dL (ref 0–40)

## 2020-09-29 LAB — ECHOCARDIOGRAM COMPLETE
AR max vel: 2.4 cm2
AV Area VTI: 2.63 cm2
AV Area mean vel: 2.26 cm2
AV Mean grad: 3 mmHg
AV Peak grad: 5 mmHg
Ao pk vel: 1.12 m/s
Area-P 1/2: 4.49 cm2
Height: 60 in
S' Lateral: 2.34 cm
Weight: 2400 oz

## 2020-09-29 LAB — CBC
HCT: 33.9 % — ABNORMAL LOW (ref 36.0–46.0)
Hemoglobin: 11.9 g/dL — ABNORMAL LOW (ref 12.0–15.0)
MCH: 30.8 pg (ref 26.0–34.0)
MCHC: 35.1 g/dL (ref 30.0–36.0)
MCV: 87.8 fL (ref 80.0–100.0)
Platelets: 370 10*3/uL (ref 150–400)
RBC: 3.86 MIL/uL — ABNORMAL LOW (ref 3.87–5.11)
RDW: 12 % (ref 11.5–15.5)
WBC: 15 10*3/uL — ABNORMAL HIGH (ref 4.0–10.5)
nRBC: 0 % (ref 0.0–0.2)

## 2020-09-29 LAB — BASIC METABOLIC PANEL
Anion gap: 8 (ref 5–15)
BUN: 6 mg/dL (ref 6–20)
CO2: 23 mmol/L (ref 22–32)
Calcium: 8.8 mg/dL — ABNORMAL LOW (ref 8.9–10.3)
Chloride: 108 mmol/L (ref 98–111)
Creatinine, Ser: 0.55 mg/dL (ref 0.44–1.00)
GFR, Estimated: >60 mL/min (ref >60)
Glucose, Bld: 124 mg/dL — ABNORMAL HIGH (ref 70–99)
Potassium: 4.3 mmol/L (ref 3.5–5.1)
Sodium: 139 mmol/L (ref 135–145)

## 2020-09-29 LAB — HEMOGLOBIN AND HEMATOCRIT, BLOOD
HCT: 33.3 % — ABNORMAL LOW (ref 36.0–46.0)
Hemoglobin: 11.6 g/dL — ABNORMAL LOW (ref 12.0–15.0)

## 2020-09-29 LAB — SURGICAL PATHOLOGY

## 2020-09-29 MED ORDER — IBUPROFEN 600 MG PO TABS: 600.0000 mg | ORAL_TABLET | Freq: Four times a day (QID) | ORAL | 0 refills | Status: AC | PRN

## 2020-09-29 MED ORDER — OXYCODONE-ACETAMINOPHEN 5-325 MG PO TABS: 1.0000 | ORAL_TABLET | ORAL | 0 refills | Status: AC | PRN

## 2020-09-29 NOTE — Progress Notes (Signed)
Subjective: Patient reports  tolerating PO, + flatus and no problems voiding.  Pain is well-controlled on current  analgesic regimen..  Still perceiving intermittent palpitations.  Objective: Vital signs in last 24 hours: Temp:  [98 F (36.7 C)-98.8 F (37.1 C)] 98.6 F (37 C) (04/27 0331) Pulse Rate:  [69-109] 80 (04/27 0331) Resp:  [11-20] 16 (04/27 0331) BP: (99-151)/(69-87) 104/69 (04/27 0331) SpO2:  [94 %-100 %] 97 % (04/27 0331) Last BM Date: 09/28/20  Intake/Output from previous day: 04/26 0701 - 04/27 0700 In: 1263.6 [P.O.:360; I.V.:903.6] Out: 2500 [Urine:2400; Blood:100]  Physical Examination: General: NAD CV: RRR Pulmonary: no increased work of breathing, CTAB Abdomen: soft, non-tender, non-distended, incision(s) D/C/I Extremities: no edema Neurologic: normal gait   Labs: Results for orders placed or performed during the hospital encounter of 09/28/20 (from the past 72 hour(s))  Pregnancy, urine POC     Status: None   Collection Time: 09/28/20  7:12 AM  Result Value Ref Range   Preg Test, Ur NEGATIVE NEGATIVE    Comment:        THE SENSITIVITY OF THIS METHODOLOGY IS >24 mIU/mL   ABO/Rh     Status: None   Collection Time: 09/28/20  7:43 AM  Result Value Ref Range   ABO/RH(D)      O POS Performed at [facility], [address] [place], [place] [address]   Electrolyte panel     Status: Abnormal   Collection Time: 09/28/20  7:01 PM  Result Value Ref Range   Sodium 137 135 - 145 mmol/L   Potassium 4.1 3.5 - 5.1 mmol/L   Chloride 104 98 - 111 mmol/L   CO2 20 (L) 22 - 32 mmol/L   Anion gap 13 5 - 15    Comment: Performed at [facility], [address] [place], [place] [address]  Magnesium     Status: None   Collection Time: 09/28/20  7:01 PM  Result Value Ref Range   Magnesium 2.1 1.7 - 2.4 mg/dL    Comment: Performed at [facility], [address] [place], [place] [address]  CBC     Status: Abnormal   Collection  Time: 09/28/20  7:01 PM  Result Value Ref Range   WBC 15.5 (H) 4.0 - 10.5 K/uL   RBC 4.36 3.87 - 5.11 MIL/uL   Hemoglobin 13.6 12.0 - 15.0 g/dL   HCT 39.1 36.0 - 46.0 %   MCV 89.7 80.0 - 100.0 fL   MCH 31.2 26.0 - 34.0 pg   MCHC 34.8 30.0 - 36.0 g/dL   RDW 11.9 11.5 - 15.5 %   Platelets 330 150 - 400 K/uL   nRBC 0.0 0.0 - 0.2 %    Comment: Performed at [facility], [place], [place] [address]  Troponin I (High Sensitivity)     Status: None   Collection Time: 09/28/20  7:01 PM  Result Value Ref Range   Troponin I (High Sensitivity) <2 <18 ng/L    Comment: (NOTE) Elevated high sensitivity troponin I (hsTnI) values and significant  changes across serial measurements may suggest ACS but many other  chronic and acute conditions are known to elevate hsTnI results.  Refer to the "Links" section for chest pain algorithms and additional  guidance. Performed at [facility], [address] [place], [place] [address]   D-dimer, quantitative     Status: None   Collection Time: 09/28/20  7:01 PM  Result Value Ref Range  D-Dimer, Quant 0.39 0.00 - 0.50 ug/mL-FEU    Comment: (NOTE) At the manufacturer cut-off value of 0.5 g/mL FEU, this assay has a negative predictive value of 95-100%.This assay is intended for use in conjunction with a clinical pretest probability (PTP) assessment model to exclude pulmonary embolism (PE) and deep venous thrombosis (DVT) in outpatients suspected of PE or DVT. Results should be correlated with clinical presentation. Performed at [facility], [address] [place], [place] [address]   Magnesium     Status: None   Collection Time: 09/28/20  8:48 PM  Result Value Ref Range   Magnesium 2.0 1.7 - 2.4 mg/dL    Comment: Performed at [facility], [place], [place] [address]  Troponin I (High Sensitivity)     Status: None   Collection Time: 09/28/20  8:48 PM  Result Value Ref Range    Troponin I (High Sensitivity) <2 <18 ng/L    Comment: (NOTE) Elevated high sensitivity troponin I (hsTnI) values and significant  changes across serial measurements may suggest ACS but many other  chronic and acute conditions are known to elevate hsTnI results.  Refer to the "Links" section for chest pain algorithms and additional  guidance. Performed at [facility], [address] [place], [place] [address]   TSH     Status: None   Collection Time: 09/28/20  8:48 PM  Result Value Ref Range   TSH 0.470 0.350 - 4.500 uIU/mL    Comment: Performed by a 3rd Generation assay with a functional sensitivity of <=0.01 uIU/mL. Performed at [facility], [address] [place], [place] [address]   Hemoglobin and hematocrit, blood     Status: None   Collection Time: 09/28/20  8:48 PM  Result Value Ref Range   Hemoglobin 13.4 12.0 - 15.0 g/dL   HCT 38.3 36.0 - 46.0 %    Comment: Performed at [facility], [address] [place], [place] [address]  CBC     Status: Abnormal   Collection Time: 09/29/20  3:27 AM  Result Value Ref Range   WBC 15.0 (H) 4.0 - 10.5 K/uL   RBC 3.86 (L) 3.87 - 5.11 MIL/uL   Hemoglobin 11.9 (L) 12.0 - 15.0 g/dL   HCT 33.9 (L) 36.0 - 46.0 %   MCV 87.8 80.0 - 100.0 fL   MCH 30.8 26.0 - 34.0 pg   MCHC 35.1 30.0 - 36.0 g/dL   RDW 12.0 11.5 - 15.5 %   Platelets 370 150 - 400 K/uL   nRBC 0.0 0.0 - 0.2 %    Comment: Performed at [facility], [address] [place],  [address]  Basic metabolic panel     Status: Abnormal   Collection Time: 09/29/20  3:27 AM  Result Value Ref Range   Sodium 139 135 - 145 mmol/L   Potassium 4.3 3.5 - 5.1 mmol/L   Chloride 108 98 - 111 mmol/L   CO2 23 22 - 32 mmol/L   Glucose, Bld 124 (H) 70 - 99 mg/dL    Comment: Glucose reference range applies only to samples taken after fasting for at least 8 hours.   BUN 6 6 - 20 mg/dL   Creatinine, Ser 0.55 0.44 - 1.00 mg/dL   Calcium 8.8 (L) 8.9  - 10.3 mg/dL   GFR, Estimated >60 >60 mL/min    Comment: (NOTE) Calculated using the CKD-EPI Creatinine Equation (2021)    Anion gap 8 5 - 15  Comment: Performed at [facility], [address] [place], [place] [address]  Lipid panel     Status: Abnormal   Collection Time: 09/29/20  3:27 AM  Result Value Ref Range   Cholesterol 286 (H) 0 - 200 mg/dL   Triglycerides 191 (H) <150 mg/dL   HDL 55 >40 mg/dL   Total CHOL/HDL Ratio 5.2 RATIO   VLDL 38 0 - 40 mg/dL   LDL Cholesterol 193 (H) 0 - 99 mg/dL    Comment:        Total Cholesterol/HDL:CHD Risk Coronary Heart Disease Risk Table                     Men   Women  1/2 Average Risk   3.4   3.3  Average Risk       5.0   4.4  2 X Average Risk   9.6   7.1  3 X Average Risk  23.4   11.0        Use the calculated Patient Ratio above and the CHD Risk Table to determine the patient's CHD Risk.        ATP III CLASSIFICATION (LDL):  <100     mg/dL   Optimal  100-129  mg/dL   Near or Above                    Optimal  130-159  mg/dL   Borderline  160-189  mg/dL   High  >190     mg/dL   Very High Performed at [facility], [address] [place], [place] [address]      Assessment:  40 y.o. s/p 1 Day Post-Op Procedure(s) (LRB): LAPAROSCOPIC ASSISTED TOTAL HYSTERECTOMY WITH BILATERAL SALPINGECTOMY (Bilateral) CYSTOSCOPY (N/A) : stable  Plan: 1) Pain: well controlled on po analgesics  2) Heme: - acute blood loss anemia, drop appropriate for intraoperative blood loss - leukocytosis appropriate WBC for postop clean contaminated procedure   3) FEN: regular diet, saline lock  4) Prophylaxis: intermittent pneumatic compression boots.  5) Palpitations: normal work up so far, cardiology consult pending  6) Disposition: The patient is to be discharged to home pending cardiology consult/recs.  [name], MD, [facility], [facility] 09/29/2020, 7:50 AM

## 2020-09-29 NOTE — Progress Notes (Signed)
Zio XT monitor to be placed for prolong QTc and PACs after anesthesia.

## 2020-09-29 NOTE — Progress Notes (Signed)
Pt discharged home. Discharge instructions, prescriptions, and follow up appointments given to and reviewed with pt. Pt verbalized understanding. To be escorted by axillary.  

## 2020-09-29 NOTE — Progress Notes (Addendum)
Progress Note    Rhonda Shaw  QMG:[id number] DOB: 1980-06-25  DOA: 09/28/2020 PCP: [place]      Brief Narrative:    Medical records reviewed and are as summarized below:  Rhonda Shaw is a 40 y.o. female       Assessment/Plan:   Active Problems:   PMS2-related Lynch syndrome (HNPCC4)   S/P laparoscopic hysterectomy   QT prolongation   Dizziness    Body mass index is 29.29 kg/m.  Brief episode of hypotension, palpitations and dizziness: Resolved.  This was probably related to anesthesia.  Hyperlipidemia: LDL 193, total cholesterol 286, triglycerides 191 and HDL 55.  Patient said she had tried simvastatin many years ago but she developed myalgia and muscle weakness and so she stopped taking it.  I recommended that she try another statin since her LDL was very high.  She said she would discuss with her PCP.  Lynch syndrome: S/p laparoscopic assisted total hysterectomy with bilateral salpingectomy.  Follow-up with gynecologist.  Leukocytosis: This is likely reactive.  Monitor CBC  Prolonged QTc interval: Improved.  Continue to monitor.  Diet Order            Diet regular Room service appropriate? Yes; Fluid consistency: Thin  Diet effective now           Diet general                   Medications:   . potassium chloride  40 mEq Oral BID  . sodium chloride flush  3 mL Intravenous Q12H   Continuous Infusions: . sodium chloride    . sodium chloride 100 mL/hr at 09/29/20 0656     Anti-infectives (From admission, onward)   Start     Dose/Rate Route Frequency Ordered Stop   09/28/20 0723  ceFAZolin (ANCEF) 2-4 GM/100ML-% IVPB       Note to Pharmacy: Olena Mater   : cabinet override      09/28/20 0723 09/28/20 0935   09/28/20 0600  ceFAZolin (ANCEF) IVPB 2g/100 mL premix        2 g 200 mL/hr over 30 Minutes Intravenous On call to O.R. 09/27/20 2227 09/28/20 0933             Family Communication/Anticipated D/C  date and plan/Code Status   DVT prophylaxis:      Code Status: Full Code  Family Communication: Discussed with husband at the bedside Disposition Plan: To be determined by gynecologist       Subjective:   Interval events noted.  C/o abdominal soreness from surgery.  No shortness of breath or chest pain.  Objective:    Vitals:   09/29/20 0331 09/29/20 0700 09/29/20 0826 09/29/20 1207  BP: 104/69  132/86 131/83  Pulse: 80  68 94  Resp: 16 14 20 18   Temp: 98.6 F (37 C)  98.2 F (36.8 C) 98.9 F (37.2 C)  TempSrc: Oral  Oral   SpO2: 97%  98% 99%  Weight:      Height:       No data found.   Intake/Output Summary (Last 24 hours) at 09/29/2020 1649 Last data filed at 09/28/2020 2300 Gross per 24 hour  Intake 443.56 ml  Output 1500 ml  Net -1056.44 ml   Filed Weights   09/28/20 0733  Weight: 68 kg    Exam:  GEN: NAD SKIN: No rash EYES: EOMI ENT: MMM CV: RRR PULM: CTA B ABD: soft, ND, NT, +BS, multiple  small incisions on the abdomen CNS: AAO x 3, non focal EXT: No edema or tenderness        Data Reviewed:   I have personally reviewed following labs and imaging studies:  Labs: Labs show the following:   Basic Metabolic Panel: Recent Labs  Lab 09/24/20 0935 09/28/20 1901 09/28/20 2048 09/29/20 0327  NA 141 137  --  139  K 3.8 4.1  --  4.3  CL 107 104  --  108  CO2 24 20*  --  23  GLUCOSE 91  --   --  124*  BUN 9  --   --  6  CREATININE 0.65  --   --  0.55  CALCIUM 9.3  --   --  8.8*  MG  --  2.1 2.0  --    GFR Estimated Creatinine Clearance: 80.4 mL/min (by C-G formula based on SCr of 0.55 mg/dL). Liver Function Tests: No results for input(s): AST, ALT, ALKPHOS, BILITOT, PROT, ALBUMIN in the last 168 hours. No results for input(s): LIPASE, AMYLASE in the last 168 hours. No results for input(s): AMMONIA in the last 168 hours. Coagulation profile No results for input(s): INR, PROTIME in the last 168 hours.  CBC: Recent Labs  Lab  09/24/20 0935 09/28/20 1901 09/28/20 2048 09/29/20 0327 09/29/20 0853  WBC 7.2 15.5*  --  15.0*  --   HGB 12.9 13.6 13.4 11.9* 11.6*  HCT 37.9 39.1 38.3 33.9* 33.3*  MCV 90.2 89.7  --  87.8  --   PLT 344 330  --  370  --    Cardiac Enzymes: No results for input(s): CKTOTAL, CKMB, CKMBINDEX, TROPONINI in the last 168 hours. BNP (last 3 results) No results for input(s): PROBNP in the last 8760 hours. CBG: No results for input(s): GLUCAP in the last 168 hours. D-Dimer: Recent Labs    09/28/20 1901  DDIMER 0.39   Hgb A1c: No results for input(s): HGBA1C in the last 72 hours. Lipid Profile: Recent Labs    09/29/20 0327  CHOL 286*  HDL 55  LDLCALC 193*  TRIG 191*  CHOLHDL 5.2   Thyroid function studies: Recent Labs    09/28/20 2048  TSH 0.470   Anemia work up: No results for input(s): VITAMINB12, FOLATE, FERRITIN, TIBC, IRON, RETICCTPCT in the last 72 hours. Sepsis Labs: Recent Labs  Lab 09/24/20 0935 09/28/20 1901 09/29/20 0327  WBC 7.2 15.5* 15.0*    Microbiology Recent Results (from the past 240 hour(s))  SARS CORONAVIRUS 2 (TAT 6-24 HRS) Nasopharyngeal Nasopharyngeal Swab     Status: None   Collection Time: 09/24/20 11:45 AM   Specimen: Nasopharyngeal Swab  Result Value Ref Range Status   SARS Coronavirus 2 NEGATIVE NEGATIVE Final    Comment: (NOTE) SARS-CoV-2 target nucleic acids are NOT DETECTED.  The SARS-CoV-2 RNA is generally detectable in upper and lower respiratory specimens during the acute phase of infection. Negative results do not preclude SARS-CoV-2 infection, do not rule out co-infections with other pathogens, and should not be used as the sole basis for treatment or other patient management decisions. Negative results must be combined with clinical observations, patient history, and epidemiological information. The expected result is Negative.  Fact Sheet for Patients: SugarRoll.be  Fact Sheet for  Healthcare Providers: https://www.woods-mathews.com/  This test is not yet approved or cleared by the [place] FDA and  has been authorized for detection and/or diagnosis of SARS-CoV-2 by FDA under an Emergency Use Authorization (EUA). This EUA will  remain  in effect (meaning this test can be used) for the duration of the COVID-19 declaration under Se ction 564(b)(1) of the Act, 21 U.S.C. section 360bbb-3(b)(1), unless the authorization is terminated or revoked sooner.  Performed at [facility], [place] [address] [place], [place] [address]     Procedures and diagnostic studies:  ECHOCARDIOGRAM COMPLETE  Result Date: 09/29/2020    ECHOCARDIOGRAM REPORT   Patient Name:   NAYELIE GIONFRIDDO Date of Exam: 09/29/2020 Medical Rec #:  [id number]      Height:       60.0 in Accession #:    [id number]     Weight:       150.0 lb Date of Birth:  1981/05/28      BSA:          1.652 m Patient Age:    84 years       BP:           132/86 mmHg Patient Gender: F              HR:           68 bpm. Exam Location:  [facility] Procedure: 2D Echo, Cardiac Doppler and Color Doppler Indications:     Murmur R01.1, PAC ,history of Covid-19  History:         Patient has no prior history of Echocardiogram examinations.                  Palpitation, pneumonia.  Sonographer:     Sherrie Sport RDCS (AE) Referring Phys:  [id number] Arvil Chaco Diagnosing Phys: Kate Sable MD  Sonographer Comments: Suboptimal apical window. IMPRESSIONS  1. Left ventricular ejection fraction, by estimation, is 60 to 65%. The left ventricle has normal function. The left ventricle has no regional wall motion abnormalities. Left ventricular diastolic parameters were normal.  2. Right ventricular systolic function is normal. The right ventricular size is normal.  3. The mitral valve is normal in structure. No evidence of mitral valve regurgitation.  4. The aortic valve is grossly normal. Aortic valve regurgitation is not visualized.  FINDINGS  Left Ventricle: Left ventricular ejection fraction, by estimation, is 60 to 65%. The left ventricle has normal function. The left ventricle has no regional wall motion abnormalities. The left ventricular internal cavity size was normal in size. There is  no left ventricular hypertrophy. Left ventricular diastolic parameters were normal. Right Ventricle: The right ventricular size is normal. No increase in right ventricular wall thickness. Right ventricular systolic function is normal. Left Atrium: Left atrial size was normal in size. Right Atrium: Right atrial size was normal in size. Pericardium: There is no evidence of pericardial effusion. Mitral Valve: The mitral valve is normal in structure. No evidence of mitral valve regurgitation. Tricuspid Valve: The tricuspid valve is grossly normal. Tricuspid valve regurgitation is not demonstrated. Aortic Valve: The aortic valve is grossly normal. Aortic valve regurgitation is not visualized. Aortic valve mean gradient measures 3.0 mmHg. Aortic valve peak gradient measures 5.0 mmHg. Aortic valve area, by VTI measures 2.63 cm. Pulmonic Valve: The pulmonic valve was not well visualized. Pulmonic valve regurgitation is not visualized. Aorta: The aortic root is normal in size and structure. IAS/Shunts: No atrial level shunt detected by color flow Doppler.  LEFT VENTRICLE PLAX 2D LVIDd:         4.10 cm  Diastology LVIDs:         2.34 cm  LV e' medial:    8.59 cm/s  LV PW:         0.78 cm  LV E/e' medial:  10.3 LV IVS:        0.82 cm  LV e' lateral:   9.57 cm/s LVOT diam:     2.00 cm  LV E/e' lateral: 9.3 LV SV:         61 LV SV Index:   37 LVOT Area:     3.14 cm  LEFT ATRIUM           Index       RIGHT ATRIUM           Index LA diam:      2.40 cm 1.45 cm/m  RA Area:     11.80 cm LA Vol (A2C): 17.9 ml 10.84 ml/m RA Volume:   26.00 ml  15.74 ml/m LA Vol (A4C): 22.2 ml 13.44 ml/m  AORTIC VALVE                   PULMONIC VALVE AV Area (Vmax):    2.40 cm    PV  Vmax:        0.75 m/s AV Area (Vmean):   2.26 cm    PV Peak grad:   2.2 mmHg AV Area (VTI):     2.63 cm    RVOT Peak grad: 2 mmHg AV Vmax:           112.15 cm/s AV Vmean:          77.900 cm/s AV VTI:            0.233 m AV Peak Grad:      5.0 mmHg AV Mean Grad:      3.0 mmHg LVOT Vmax:         85.50 cm/s LVOT Vmean:        56.000 cm/s LVOT VTI:          0.195 m LVOT/AV VTI ratio: 0.84  AORTA Ao Root diam: 2.63 cm MITRAL VALVE               TRICUSPID VALVE MV Area (PHT): 4.49 cm    TR Peak grad:   8.3 mmHg MV Decel Time: 169 msec    TR Vmax:        144.00 cm/s MV E velocity: 88.70 cm/s MV A velocity: 63.40 cm/s  SHUNTS MV E/A ratio:  1.40        Systemic VTI:  0.20 m                            Systemic Diam: 2.00 cm Kate Sable MD Electronically signed by Kate Sable MD Signature Date/Time: 09/29/2020/10:44:12 AM    Final                LOS: 0 days   Avira Tillison  [facility]   Pager on [email or web address]. If 7PM-7AM, please contact night-coverage at [email or web address]     09/29/2020, 4:49 PM

## 2020-09-29 NOTE — Discharge Summary (Signed)
Physician Discharge Summary  Patient ID: Rhonda Shaw MRN: [id number] DOB/AGE: 1980-12-11 40 y.o.  Admit date: 09/28/2020 Discharge date: 09/29/2020  Admission Diagnoses:  Discharge Diagnoses:  Active Problems:   PMS2-related Lynch syndrome (HNPCC4)   S/P laparoscopic hysterectomy   QT prolongation   Dizziness   Discharged Condition: good  Hospital Course: 40 y.o. [id number], known PMS2 carrier, who underwent elective risk reducing hysterectomy for elevated lifetime risk of endometrial cancer on 09/28/2020.  Procedure was uncomplicated.  Postoperatively the patient became aware of palpitations.  Some light dizziness, no associated chest pain or nausea.  Medicine and cardiology consults were obtained.   Cardiac work up including serial troponin, D-dimer, and echo normal.  EKG did show prolonged QT with intermittent PAC.  QT interval improved on follow up with symptoms likely attributable to recent anesthesia.  Plan for outpatient follow up with cardiology.  From a postoperative standpoint the patient was meeting all postoperative goals by POD1 including remaining afebrile and otherwise hemodynamically stable, tolerating po, ambulating, voiding, and reporting good pain control on po analgesics.  POD1 CBC revealed stable renal function and CBC.  g    Consults: cardiology and internal medicine  Significant Diagnostic Studies: Results for orders placed or performed during the hospital encounter of 09/28/20 (from the past 24 hour(s))  Electrolyte panel     Status: Abnormal   Collection Time: 09/28/20  7:01 PM  Result Value Ref Range   Sodium 137 135 - 145 mmol/L   Potassium 4.1 3.5 - 5.1 mmol/L   Chloride 104 98 - 111 mmol/L   CO2 20 (L) 22 - 32 mmol/L   Anion gap 13 5 - 15  Magnesium     Status: None   Collection Time: 09/28/20  7:01 PM  Result Value Ref Range   Magnesium 2.1 1.7 - 2.4 mg/dL  CBC     Status: Abnormal   Collection Time: 09/28/20  7:01 PM  Result Value Ref Range   WBC  15.5 (H) 4.0 - 10.5 K/uL   RBC 4.36 3.87 - 5.11 MIL/uL   Hemoglobin 13.6 12.0 - 15.0 g/dL   HCT 39.1 36.0 - 46.0 %   MCV 89.7 80.0 - 100.0 fL   MCH 31.2 26.0 - 34.0 pg   MCHC 34.8 30.0 - 36.0 g/dL   RDW 11.9 11.5 - 15.5 %   Platelets 330 150 - 400 K/uL   nRBC 0.0 0.0 - 0.2 %  Troponin I (High Sensitivity)     Status: None   Collection Time: 09/28/20  7:01 PM  Result Value Ref Range   Troponin I (High Sensitivity) <2 <18 ng/L  D-dimer, quantitative     Status: None   Collection Time: 09/28/20  7:01 PM  Result Value Ref Range   D-Dimer, Quant 0.39 0.00 - 0.50 ug/mL-FEU  Magnesium     Status: None   Collection Time: 09/28/20  8:48 PM  Result Value Ref Range   Magnesium 2.0 1.7 - 2.4 mg/dL  Troponin I (High Sensitivity)     Status: None   Collection Time: 09/28/20  8:48 PM  Result Value Ref Range   Troponin I (High Sensitivity) <2 <18 ng/L  TSH     Status: None   Collection Time: 09/28/20  8:48 PM  Result Value Ref Range   TSH 0.470 0.350 - 4.500 uIU/mL  Hemoglobin and hematocrit, blood     Status: None   Collection Time: 09/28/20  8:48 PM  Result Value Ref Range   Hemoglobin 13.4  12.0 - 15.0 g/dL   HCT 38.3 36.0 - 46.0 %  CBC     Status: Abnormal   Collection Time: 09/29/20  3:27 AM  Result Value Ref Range   WBC 15.0 (H) 4.0 - 10.5 K/uL   RBC 3.86 (L) 3.87 - 5.11 MIL/uL   Hemoglobin 11.9 (L) 12.0 - 15.0 g/dL   HCT 33.9 (L) 36.0 - 46.0 %   MCV 87.8 80.0 - 100.0 fL   MCH 30.8 26.0 - 34.0 pg   MCHC 35.1 30.0 - 36.0 g/dL   RDW 12.0 11.5 - 15.5 %   Platelets 370 150 - 400 K/uL   nRBC 0.0 0.0 - 0.2 %  Basic metabolic panel     Status: Abnormal   Collection Time: 09/29/20  3:27 AM  Result Value Ref Range   Sodium 139 135 - 145 mmol/L   Potassium 4.3 3.5 - 5.1 mmol/L   Chloride 108 98 - 111 mmol/L   CO2 23 22 - 32 mmol/L   Glucose, Bld 124 (H) 70 - 99 mg/dL   BUN 6 6 - 20 mg/dL   Creatinine, Ser 0.55 0.44 - 1.00 mg/dL   Calcium 8.8 (L) 8.9 - 10.3 mg/dL   GFR, Estimated  >60 >60 mL/min   Anion gap 8 5 - 15  Lipid panel     Status: Abnormal   Collection Time: 09/29/20  3:27 AM  Result Value Ref Range   Cholesterol 286 (H) 0 - 200 mg/dL   Triglycerides 191 (H) <150 mg/dL   HDL 55 >40 mg/dL   Total CHOL/HDL Ratio 5.2 RATIO   VLDL 38 0 - 40 mg/dL   LDL Cholesterol 193 (H) 0 - 99 mg/dL  Hemoglobin and hematocrit, blood     Status: Abnormal   Collection Time: 09/29/20  8:53 AM  Result Value Ref Range   Hemoglobin 11.6 (L) 12.0 - 15.0 g/dL   HCT 33.3 (L) 36.0 - 46.0 %   ECHOCARDIOGRAM COMPLETE  Result Date: 09/29/2020    ECHOCARDIOGRAM REPORT   Patient Name:   Rhonda Shaw Date of Exam: 09/29/2020 Medical Rec #:  [id number]      Height:       60.0 in Accession #:    [id number]     Weight:       150.0 lb Date of Birth:  1980-06-16      BSA:          1.652 m Patient Age:    68 years       BP:           132/86 mmHg Patient Gender: F              HR:           68 bpm. Exam Location:  [facility] Procedure: 2D Echo, Cardiac Doppler and Color Doppler Indications:     Murmur R01.1, PAC ,history of Covid-19  History:         Patient has no prior history of Echocardiogram examinations.                  Palpitation, pneumonia.  Sonographer:     Sherrie Sport RDCS (AE) Referring Phys:  [id number] Arvil Chaco Diagnosing Phys: Kate Sable MD  Sonographer Comments: Suboptimal apical window. IMPRESSIONS  1. Left ventricular ejection fraction, by estimation, is 60 to 65%. The left ventricle has normal function. The left ventricle has no regional wall motion abnormalities. Left ventricular diastolic parameters were normal.  2. Right  ventricular systolic function is normal. The right ventricular size is normal.  3. The mitral valve is normal in structure. No evidence of mitral valve regurgitation.  4. The aortic valve is grossly normal. Aortic valve regurgitation is not visualized. FINDINGS  Left Ventricle: Left ventricular ejection fraction, by estimation, is 60 to 65%. The left  ventricle has normal function. The left ventricle has no regional wall motion abnormalities. The left ventricular internal cavity size was normal in size. There is  no left ventricular hypertrophy. Left ventricular diastolic parameters were normal. Right Ventricle: The right ventricular size is normal. No increase in right ventricular wall thickness. Right ventricular systolic function is normal. Left Atrium: Left atrial size was normal in size. Right Atrium: Right atrial size was normal in size. Pericardium: There is no evidence of pericardial effusion. Mitral Valve: The mitral valve is normal in structure. No evidence of mitral valve regurgitation. Tricuspid Valve: The tricuspid valve is grossly normal. Tricuspid valve regurgitation is not demonstrated. Aortic Valve: The aortic valve is grossly normal. Aortic valve regurgitation is not visualized. Aortic valve mean gradient measures 3.0 mmHg. Aortic valve peak gradient measures 5.0 mmHg. Aortic valve area, by VTI measures 2.63 cm. Pulmonic Valve: The pulmonic valve was not well visualized. Pulmonic valve regurgitation is not visualized. Aorta: The aortic root is normal in size and structure. IAS/Shunts: No atrial level shunt detected by color flow Doppler.  LEFT VENTRICLE PLAX 2D LVIDd:         4.10 cm  Diastology LVIDs:         2.34 cm  LV e' medial:    8.59 cm/s LV PW:         0.78 cm  LV E/e' medial:  10.3 LV IVS:        0.82 cm  LV e' lateral:   9.57 cm/s LVOT diam:     2.00 cm  LV E/e' lateral: 9.3 LV SV:         61 LV SV Index:   37 LVOT Area:     3.14 cm  LEFT ATRIUM           Index       RIGHT ATRIUM           Index LA diam:      2.40 cm 1.45 cm/m  RA Area:     11.80 cm LA Vol (A2C): 17.9 ml 10.84 ml/m RA Volume:   26.00 ml  15.74 ml/m LA Vol (A4C): 22.2 ml 13.44 ml/m  AORTIC VALVE                   PULMONIC VALVE AV Area (Vmax):    2.40 cm    PV Vmax:        0.75 m/s AV Area (Vmean):   2.26 cm    PV Peak grad:   2.2 mmHg AV Area (VTI):     2.63  cm    RVOT Peak grad: 2 mmHg AV Vmax:           112.15 cm/s AV Vmean:          77.900 cm/s AV VTI:            0.233 m AV Peak Grad:      5.0 mmHg AV Mean Grad:      3.0 mmHg LVOT Vmax:         85.50 cm/s LVOT Vmean:        56.000 cm/s LVOT VTI:  0.195 m LVOT/AV VTI ratio: 0.84  AORTA Ao Root diam: 2.63 cm MITRAL VALVE               TRICUSPID VALVE MV Area (PHT): 4.49 cm    TR Peak grad:   8.3 mmHg MV Decel Time: 169 msec    TR Vmax:        144.00 cm/s MV E velocity: 88.70 cm/s MV A velocity: 63.40 cm/s  SHUNTS MV E/A ratio:  1.40        Systemic VTI:  0.20 m                            Systemic Diam: 2.00 cm Kate Sable MD Electronically signed by Kate Sable MD Signature Date/Time: 09/29/2020/10:44:12 AM    Final      Treatments: surgery: TLH, BS, cystoscopy  Discharge Exam: Blood pressure 131/83, pulse 94, temperature 98.9 F (37.2 C), resp. rate 18, height 5' (1.524 m), weight 68 kg, last menstrual period 09/17/2020, SpO2 99 %. General appearance: alert, appears stated age and no distress Resp: clear to auscultation bilaterally Cardio: regular rate and rhythm, S1, S2 normal, no murmur, click, rub or gallop and RRR Extremities: extremities normal, atraumatic, no cyanosis or edema Incision/Wound: D/C/I  Disposition: Discharge disposition: 01-Home or Self Care       Discharge Instructions    Call MD for:   Complete by: As directed    Heavy vaginal bleeding greater than 1 pad an hour   Call MD for:  difficulty breathing, headache or visual disturbances   Complete by: As directed    Call MD for:  extreme fatigue   Complete by: As directed    Call MD for:  hives   Complete by: As directed    Call MD for:  persistant dizziness or light-headedness   Complete by: As directed    Call MD for:  persistant nausea and vomiting   Complete by: As directed    Call MD for:  redness, tenderness, or signs of infection (pain, swelling, redness, odor or green/yellow discharge  around incision site)   Complete by: As directed    Call MD for:  severe uncontrolled pain   Complete by: As directed    Call MD for:  temperature >100.4   Complete by: As directed    Diet general   Complete by: As directed    Discharge wound care:   Complete by: As directed    You may apply a light dressing for minor discharge from the incision or to keep waistbands of clothing from rubbing.  You may shower, use soap on your incision.  Avoid baths or soaking the incision in the first 6 weeks following your surgery..   Driving restriction   Complete by: As directed    Avoid driving for at least 2 weeks or while taking prescription narcotics.   Lifting restrictions   Complete by: As directed    Weight restriction of 10lbs for 6 weeks.     Allergies as of 09/29/2020      Reactions   Sulfa Antibiotics Hives      Medication List    TAKE these medications   ibuprofen 600 MG tablet Commonly known as: ADVIL Take 1 tablet (600 mg total) by mouth every 6 (six) hours as needed.   MULTIVITAMINS PO Take 1 tablet by mouth daily.   oxyCODONE-acetaminophen 5-325 MG tablet Commonly known as: PERCOCET/ROXICET Take 1-2 tablets by mouth  every 4 (four) hours as needed for moderate pain.            Discharge Care Instructions  (From admission, onward)         Start     Ordered   09/29/20 0000  Discharge wound care:       Comments: You may apply a light dressing for minor discharge from the incision or to keep waistbands of clothing from rubbing.  You may shower, use soap on your incision.  Avoid baths or soaking the incision in the first 6 weeks following your surgery.Marland Kitchen   09/29/20 1222          Follow-up Information    [name], MD In 1 week.   Specialty: Obstetrics and Gynecology Why: For wound re-check Contact information: [address] [place] [address] [phone]               Signed: [name] 09/29/2020, 12:23 PM

## 2020-09-29 NOTE — Telephone Encounter (Signed)
Currently admitted.

## 2020-09-29 NOTE — Telephone Encounter (Signed)
-----   Message from Arvil Chaco, PA-C sent at 09/29/2020  1:19 PM EDT ----- Regarding: Follow-up Hello,  This patient was admitted and seen at [facility] for palpitations.  We are expecting discharge with [facility] today.  Can you please call and arrange / schedule for follow-up TCM appointment with Dr. Garen Lah or APP in 4 weeks?  Thank you! Signed, Arvil Chaco, PA-C 09/29/2020, 1:19 PM Pager [phone]

## 2020-09-29 NOTE — Consult Note (Signed)
Cardiology Consultation:   Patient ID: Rhonda Shaw MRN: [id number]; DOB: 15-Nov-1980  Admit date: 09/28/2020 Date of Consult: 09/29/2020  PCP:  [place]  Cardiologist:  [facility], Dr. Garen Lah Advanced Practice Provider:  No care team member to display Electrophysiologist:  [id number]}    Patient Profile:   Rhonda Shaw is a 40 y.o. female with a hx of hyperlipidemia, hypercholesterolemia, COVID-19 05/2020, family history of adverse reaction to anesthesia, and Lynch syndrome s/p laparoscopic assisted vaginal hysterectomy and bilateral salpingectomy and cystoscopy, and who is being seen today for the evaluation of palpitations at the request of Dr. Sidney Shaw.  History of Present Illness:   Rhonda Shaw is a 40 year old G2 P63-0-0-2 female with PMH as above and significant for COVID-19 05/2020 and Lynch syndrome s/p recent hysterectomy with bilateral salpingectomy and cystoscopy.   She denies any tobacco use.  She occasionally has a glass of wine.  No drug use. She usually drinks 1 to 2 cups of coffee maximum.  She reports family history of adverse reaction to anesthesia as her mother had a faster heart rhythm after anesthesia once. Family history also includes CAD and heart failure in both of her grandparents, as well as valvular disease including mitral valve replacement.  She reports her grandmother that underwent open heart surgery in her 58s and is deceased after need for second open heart surgery.   Following receipt of anesthesia for her hysterectomy, she noticed palpitations or strong feeling of a skipped heartbeat / kick in her chest that was accompanied by a warm/flushed feeling that would spread up her throat and into her face. Sometimes, the flushing occurred before the palpitations.  This would last for seconds before dissipating. No clear triggers, though she does wonder the association with anesthesia. She has never had a  feeling like this before, though states that she has had feelings of rapid heart rate when drinking coffee before in the past.  No associated CP.  No SOB/DOE.  When the palpitations first started, she did feel weak and unsteady on her feet, but around 3 AM this morning, she was starting to feel back to her usual self.  She reports her blood pressure usually SBP 110s but overnight RNs reported SBP 150s and some tachycardic rates up to 120s.   Most recent EKG in her paper chart on the floor shows NSR with PACs. Initial EKG 09/28/2020 after surgery was NSR, 96 bpm, prolonged QTC at 530 ms, poor R wave in III, avF. EKG from 09/28/2008 in EMR shows NSR, 84 bpm, prolonged QTC 493 ms, poor R wave progression in leads III, aVF, PACs with compensatory pause. Labs show renal function and electrolytes stable. Cholesterol labs show HLD and hypertriglyceridemia total cholesterol 286 with LDL 193 and Tg 191.  Following surgery, blood loss anemia noted with RBC 3.6, Hgb 11.9 with Hct 33.9 and WBC 15.0. Ddimer negative. TSH wnl.    Past Medical History:  Diagnosis Date  . COVID-19 05/06/2020  . Family history of breast cancer 04/2020   IBIS=18.2%/riskscore=16.5%  . Family history of Lynch syndrome   . Hypercholesterolemia   . Motion sickness    cars  . PMS2-related Lynch syndrome (HNPCC4) 04/2020   Myriad MyRisk  . Pneumonia    as a child  . PONV (postoperative nausea and vomiting)   . Wears contact lenses     Past Surgical History:  Procedure Laterality Date  . APPENDECTOMY  11/2019  . BIOPSY N/A  08/16/2020   Procedure: BIOPSY;  Surgeon: Lucilla Lame, MD;  Location: [place] [facility];  Service: Endoscopy;  Laterality: N/A;  . COLONOSCOPY WITH PROPOFOL N/A 08/16/2020   Procedure: COLONOSCOPY WITH PROPOFOL;  Surgeon: Lucilla Lame, MD;  Location: [place];  Service: Endoscopy;  Laterality: N/A;  priority 4  . ESOPHAGOGASTRODUODENOSCOPY N/A 08/16/2020   Procedure: ESOPHAGOGASTRODUODENOSCOPY (EGD);   Surgeon: Lucilla Lame, MD;  Location: [facility];  Service: Endoscopy;  Laterality: N/A;     Home Medications:  Prior to Admission medications   Medication Sig Start Date End Date Taking? Authorizing Provider  Multiple Vitamin (MULTIVITAMINS PO) Take 1 tablet by mouth daily.   Yes [provider]    Inpatient Medications: Scheduled Meds: . potassium chloride  40 mEq Oral BID  . sodium chloride flush  3 mL Intravenous Q12H   Continuous Infusions: . sodium chloride    . sodium chloride 100 mL/hr at 09/29/20 0656   PRN Meds: sodium chloride, acetaminophen, ketorolac, menthol-cetylpyridinium, morphine injection, ondansetron **OR** ondansetron (ZOFRAN) IV, oxyCODONE-acetaminophen, sodium chloride flush  Allergies:    Allergies  Allergen Reactions  . Sulfa Antibiotics Hives    Social History:   Social History   Socioeconomic History  . Marital status: Married    Spouse name: Rhonda Shaw  . Number of children: 2  . Years of education: Not on file  . Highest education level: Not on file  Occupational History  . Not on file  Tobacco Use  . Smoking status: Never Smoker  . Smokeless tobacco: Never Used  Vaping Use  . Vaping Use: Never used  Substance and Sexual Activity  . Alcohol use: Yes    Comment: rarely  . Drug use: No  . Sexual activity: Yes    Birth control/protection: None  Other Topics Concern  . Not on file  Social History Narrative  . Not on file   Social Determinants of Health   Financial Resource Strain: Not on file  Food Insecurity: Not on file  Transportation Needs: Not on file  Physical Activity: Not on file  Stress: Not on file  Social Connections: Not on file  Intimate Partner Violence: Not on file    Family History:   Family History  Problem Relation Age of Onset  . Breast cancer Paternal Aunt 74       triple neg; Lynch syndrome  . Colon cancer Paternal Aunt 27  . Prostate cancer Paternal Great-grandfather        18s  . Other  Sister 1       Lynch Syndrome Positive  . Breast cancer Paternal Aunt        not sure of age  . Prostate cancer Paternal Grandfather 20     ROS:  Please see the history of present illness.  Review of Systems  Constitutional:       Fatigue and weakness following anesthesia  Respiratory: Negative for shortness of breath.   Cardiovascular: Positive for palpitations. Negative for chest pain, orthopnea, claudication, leg swelling and PND.       Palpitations strongest following anesthesia  Musculoskeletal: Negative for falls.  Neurological: Positive for dizziness. Negative for loss of consciousness.       Dizziness better described as weakness, though dissapating  All other systems reviewed and are negative.   All other ROS reviewed and negative.     Physical Exam/Data:   Vitals:   09/28/20 1644 09/28/20 1927 09/28/20 2322 09/29/20 0331  BP: 133/86 (!) 151/87 120/75 104/69  Pulse:  88 (!) 109 84 80  Resp: 20 16 16 16   Temp: 98.2 F (36.8 C) 98.6 F (37 C) 98.8 F (37.1 C) 98.6 F (37 C)  TempSrc:  Oral Oral Oral  SpO2: 97% 99% 97% 97%  Weight:      Height:        Intake/Output Summary (Last 24 hours) at 09/29/2020 0820 Last data filed at 09/28/2020 2300 Gross per 24 hour  Intake 1263.56 ml  Output 2500 ml  Net -1236.44 ml   Last 3 Weights 09/28/2020 09/20/2020 09/14/2020  Weight (lbs) 150 lb 150 lb 152 lb  Weight (kg) 68.04 kg 68.04 kg 68.947 kg     Body mass index is 29.29 kg/m.  General:  Well nourished, well developed, in no acute distress.  Joined by her husband HEENT: normal Lymph: no adenopathy Neck: no JVD Endocrine:  No thryomegaly Vascular: No carotid bruits; FA pulses 2+ bilaterally without bruits  Cardiac:  normal S1, S2; RRR; 1/6 systolic murmur Lungs:  clear to auscultation bilaterally, no wheezing, rhonchi or rales  Abd: soft, nontender, no hepatomegaly  Ext: no edema Musculoskeletal:  No deformities, BUE and BLE strength normal and equal Skin: warm  and dry  Neuro:  CNs 2-12 intact, no focal abnormalities noted Psych:  Normal affect   EKG:  The EKG was personally reviewed and demonstrates:  NSR, PACs, prolonged QTc  NSR with PACs. Initial EKG 09/28/2020 after surgery was NSR, 96 bpm, prolonged QTC at 530 ms, poor R wave in III, avF. EKG from 09/28/2008 in EMR shows NSR, 84 bpm, prolonged QTC 493 ms, poor R wave progression in leads III, aVF, PACs with compensatory pause. Telemetry:  Telemetry was personally reviewed and demonstrates:  Not on telemetry  Relevant CV Studies: Pending limited eho  Laboratory Data:  High Sensitivity Troponin:   Recent Labs  Lab 09/28/20 1901 09/28/20 2048  TROPONINIHS <2 <2     Chemistry Recent Labs  Lab 09/24/20 0935 09/28/20 1901 09/29/20 0327  NA 141 137 139  K 3.8 4.1 4.3  CL 107 104 108  CO2 24 20* 23  GLUCOSE 91  --  124*  BUN 9  --  6  CREATININE 0.65  --  0.55  CALCIUM 9.3  --  8.8*  GFRNONAA >60  --  >60  ANIONGAP 10 13 8     No results for input(s): PROT, ALBUMIN, AST, ALT, ALKPHOS, BILITOT in the last 168 hours. Hematology Recent Labs  Lab 09/24/20 0935 09/28/20 1901 09/28/20 2048 09/29/20 0327  WBC 7.2 15.5*  --  15.0*  RBC 4.20 4.36  --  3.86*  HGB 12.9 13.6 13.4 11.9*  HCT 37.9 39.1 38.3 33.9*  MCV 90.2 89.7  --  87.8  MCH 30.7 31.2  --  30.8  MCHC 34.0 34.8  --  35.1  RDW 12.1 11.9  --  12.0  PLT 344 330  --  370   BNPNo results for input(s): BNP, PROBNP in the last 168 hours.  DDimer  Recent Labs  Lab 09/28/20 1901  DDIMER 0.39     Radiology/Studies:  No results found.   Assessment and Plan:   Palpitations, PACs Prolonged QTc s/p anesthesia FHx of adverse reaction to anesthesia -- Reports palpitations with onset after anesthesia and associated flushing/weakness, improving since receipt of anesthesia. She does have FHx of adverse reaction to anesthesia. EKG without significant arrhythmia or ST/T changes and NSR with PACs and prolonged QTC  immediately after anesthesia. QTc improving on subsequent EKGs  and with greater time since receipt of anesthesia. Consider that sx may be 2/2 anesthesia, given their improvement since receipt of anesthesia, FHx of poor response to anesthesia, and improvement in EKG ectopy/QT since anesthesia receipt.  Leading up to her hysterectomy, she denies any concerning symptoms for angina or any exertional sx. Will obtain echo to assess EF and rule out any structural heart disease. If sx continue, could consider Zio XT x2 weeks +/- low dose BB; however, will defer for now and pending echo results with further recommendations at that time. No plan for any invasive workup this admission.   HLD, hypertriglyceridemia -- Total cholesterol, LDL, and Tg elevated. Recommend dietary and lifestyle changes.  If total cholesterol and triglycerides remain elevated, despite lifestyle changes, consider medical management at that time for risk factor modification and given family history of CAD.  History of COVID-19 -- History of COVID-19 in 05/2020.  Palpitations as above, will obtain limited echo.  Further recommendation per attending.  Systolic murmur -- Faint systolic murmur appreciated on exam. Will obtain echo as above.                   For questions or updates, please contact [place] Please consult [email or web address] for contact info under    Signed, Arvil Chaco, PA-C  09/29/2020 8:20 AM

## 2020-09-29 NOTE — Anesthesia Postprocedure Evaluation (Signed)
Anesthesia Post Note  Patient: Rhonda Shaw  Procedure(s) Performed: LAPAROSCOPIC ASSISTED TOTAL HYSTERECTOMY WITH BILATERAL SALPINGECTOMY (Bilateral ) CYSTOSCOPY (N/A )  Patient location during evaluation: PACU Anesthesia Type: General Level of consciousness: awake and alert Pain management: pain level controlled Vital Signs Assessment: post-procedure vital signs reviewed and stable Respiratory status: spontaneous breathing, nonlabored ventilation and respiratory function stable Cardiovascular status: blood pressure returned to baseline and stable Postop Assessment: no apparent nausea or vomiting Anesthetic complications: no   No complications documented.   Last Vitals:  Vitals:   09/29/20 0700 09/29/20 0826  BP:  132/86  Pulse:  68  Resp: 14 20  Temp:  36.8 C  SpO2:  98%    Last Pain:  Vitals:   09/29/20 0826  TempSrc: Oral  PainSc:                  Tera Mater

## 2020-09-29 NOTE — Progress Notes (Signed)
*  PRELIMINARY RESULTS* Echocardiogram 2D Echocardiogram has been performed.  Rhonda Shaw 09/29/2020, 9:55 AM

## 2020-09-29 NOTE — Discharge Instructions (Signed)
Laparoscopically Assisted Vaginal Hysterectomy, Care After The following information offers guidance on how to care for yourself after your procedure. Your health care provider may also give you more specific instructions. If you have problems or questions, contact your health care provider. What can I expect after the procedure? After the procedure, it is common to have:  Soreness and numbness in your incision areas.  Abdominal pain. You will be given pain medicine to control it.  Vaginal bleeding and discharge. You will need to use a sanitary pad after this procedure.  Tiredness (fatigue).  Poor appetite.  Less interest in sex.  Feelings of sadness or other emotions. If your ovaries were also removed, it is common to have symptoms of menopause, such as hot flashes, night sweats, and lack of sleep (insomnia). Follow these instructions at home: Medicines  Take over-the-counter and prescription medicines only as told by your health care provider.  Do not take aspirin or NSAIDs, such as ibuprofen. These medicines can cause bleeding.  Ask your health care provider if the medicine prescribed to you: ? Requires you to avoid driving or using heavy machinery. ? Can cause constipation. You may need to take these actions to prevent or treat constipation:  Drink enough fluid to keep your urine pale yellow.  Take over-the-counter or prescription medicines.  Eat foods that are high in fiber, such as beans, whole grains, and fresh fruits and vegetables.  Limit foods that are high in fat and processed sugars, such as fried or sweet foods. Incision care  Follow instructions from your health care provider about how to take care of your incisions. Make sure you: ? Wash your hands with soap and water for at least 20 seconds before and after you change your bandage (dressing). If soap and water are not available, use hand sanitizer. ? Change your dressing as told by your health care  provider. ? Leave stitches (sutures), skin glue, or adhesive strips in place. These skin closures may need to stay in place for 2 weeks or longer. If adhesive strip edges start to loosen and curl up, you may trim the loose edges. Do not remove adhesive strips completely unless your health care provider tells you to do that.  Check your incision areas every day for signs of infection. Check for: ? More redness, swelling, or pain. ? Fluid or blood. ? Warmth. ? Pus or a bad smell.   Activity  Rest as told by your healthcare provider.  Return to your normal activities as told by your health care provider. Ask your health care provider what activities are safe for you.  Avoid sitting for a long time without moving. Get up to take short walks every 1-2 hours. This is important to improve blood flow and breathing. Ask for help if you feel weak or unsteady.  Do not lift anything that is heavier than 10 lb (4.5 kg), or the limit that you are told, until your health care provider says that it is safe.  If you were given a sedative during the procedure, it can affect you for several hours. Do not drive or operate machinery until your health care provider says that it is safe.   Lifestyle  Do not use any products that contain nicotine or tobacco. These products include cigarettes, chewing tobacco, and vaping devices, such as e-cigarettes. These can delay healing after surgery. If you need help quitting, ask your health care provider.  Do not drink alcohol until your health care provider approves. General  instructions  Do not douche, use tampons, or have sex for at least 6 weeks, or as told by your health care provider.  If you struggle with physical or emotional changes after your procedure, speak with your health care provider or a therapist.  Do not take baths, swim, or use a hot tub until your health care provider approves. You may only be allowed to take showers for 2-3 weeks.  Keep your  dressing dry until your health care provider says it can be removed.  Try to have someone at home with you for the first 1-2 weeks to help with your daily chores.  Wear compression stockings as told by your health care provider. These stockings help to prevent blood clots and reduce swelling in your legs.  Keep all follow-up visits. This is important.   Contact a health care provider if:  You have any of these signs of infection: ? More redness, swelling, warmth, or pain around an incision. ? Fluid or blood coming from an incision. ? Pus or a bad smell coming from an incision. ? Chills or a fever.  An incision opens.  Your pain medicine is not helping.  You feel dizzy or light-headed.  You have pain or bleeding when you urinate.  You have nausea and vomiting that does not go away.  You have pus, or a bad-smelling discharge coming from your vagina. Get help right away if:  You have a fever and your symptoms suddenly get worse.  You have severe abdominal pain.  You have chest pain.  You have shortness of breath.  You faint.  You have pain, swelling, or redness in your leg.  You have heavy vaginal bleeding and blood clots, soaking through a sanitary pad in less than 1 hour. These symptoms may represent a serious problem that is an emergency. Do not wait to see if the symptoms will go away. Get medical help right away. Call your local emergency services (911 in the [place] Do not drive yourself to the hospital. Summary  After the procedure, it is common to have abdominal pain and vaginal bleeding.  Wear a sanitary pad for vaginal discharge or bleeding.  You should not drive or lift heavy objects until your health care provider says that it is safe.  Contact your health care provider if you have any symptoms of infection, heavy vaginal bleeding, nausea, vomiting, or shortness of breath. This information is not intended to replace advice given to you by your health care  provider. Make sure you discuss any questions you have with your health care provider. Document Revised: 01/23/2020 Document Reviewed: 01/23/2020 Elsevier Patient Education  [employer] [place].

## 2020-10-05 ENCOUNTER — Other Ambulatory Visit: Payer: Self-pay

## 2020-10-05 ENCOUNTER — Ambulatory Visit (INDEPENDENT_AMBULATORY_CARE_PROVIDER_SITE_OTHER): Payer: No Typology Code available for payment source | Admitting: Obstetrics and Gynecology

## 2020-10-05 ENCOUNTER — Encounter: Payer: Self-pay | Admitting: Obstetrics and Gynecology

## 2020-10-05 VITALS — BP 122/76 | Wt 151.0 lb

## 2020-10-05 DIAGNOSIS — Z4889 Encounter for other specified surgical aftercare: Secondary | ICD-10-CM

## 2020-10-05 NOTE — Progress Notes (Signed)
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  Subjective: Patient reports marked improvement in her preop symptoms. Eating a regular diet without difficulty. Pain is controlled with current analgesics. Medications being used: prescription NSAID's including ibuprofen (Motrin).  Activity: normal activities of daily living.  Objective: Blood pressure 122/76, weight 151 lb (68.5 kg), last menstrual period 09/17/2020.  General: NAD Pulmonary: no increased work of breathing Abdomen: soft, non-tender, non-distended, incision(s) D/C/I Extremities: no edema Neurologic: normal gait  Admission on 09/28/2020, Discharged on 09/29/2020  Component Date Value Ref Range Status  . ABO/RH(D) 09/28/2020    Final                   Value:O POS Performed at [facility], [address], [place],  [address]   . Preg Test, Ur 09/28/2020 NEGATIVE  NEGATIVE Final   Comment:        THE SENSITIVITY OF THIS METHODOLOGY IS >24 mIU/mL   . SURGICAL PATHOLOGY 09/28/2020    Final-Edited                   Value:SURGICAL PATHOLOGY CASE: [id number] PATIENT: Rhonda Shaw Surgical Pathology Report     Specimen Submitted: A. 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 UTERUS AND CERVIX WITH BILATERAL FALLOPIAN TUBES; TOTAL HYSTERECTOMY WITH BILATERAL SALPINGECTOMY: - CERVIX:      - NEGATIVE FOR DYSPLASIA AND MALIGNANCY. - ENDOMETRIUM:      - PROLIFERATIVE.  NEGATIVE FOR ATYPIA / EIN AND MALIGNANCY. - MYOMETRIUM:      - LEIOMYOMA. - BILATERAL FALLOPIAN TUBES:      - NO SIGNIFICANT PATHOLOGIC ALTERATION.  GROSS DESCRIPTION: A. Labeled: Uterus, cervix, bilateral fallopian tubes Received: Formalin Collection time: 10:32 AM on 09/28/2020 Placed into formalin time: 10:47 AM on 09/28/2020 Weight: 101 grams Dimensions:      Fundus -9.1 (superior  to inferior) x 6.4 (breath of uterus at fundus) x 3.4 (anterior to posterior) cm      Cervix -5.9 x 4.7 cm Serosa: The serosa is tan-pink, smooth, and gli                         stening with scattered fine areas of adhesions on the posterior body. Cervix: The cervix is white-pink, smooth, and pearly. Endocervix: The endocervix is tan, mucoid, and finely granular with a 1.1 cm slit-like endocervical os.  The adjacent areas of cautery are inked as follows: Anterior = blue and posterior = black. Endometrial cavity:      Dimensions -4.4 (superior to inferior) x 2.9 (cornu to cornu) cm      Thickness -ranges from 0.1-0.2 cm      Other findings -the endometrium is tan-red and finely granular.  No distinct lesions are identified. Myometrium:     Thickness -1.8 cm     Other findings -the myometrium is tan-pink and soft with a single intramural nodule, 0.3 x 0.3 x 0.2 cm.  The nodule is well-circumscribed with a tan-white and firm cut surface.  There are also scattered areas of slight trabeculation on the posterior body. Adnexa:           Measurements -5.6 cm in length x 0.7 cm in diameter           Other findings -the fallopian tube is fimbriated wi  th a pink-purple, smooth, and glistening serosa.  There is a single 0.4 x 0.4 x 0.3 cm paratubal cyst.  The lumen is pinpoint and patent.      Left fallopian tube            Measurements -7.2 cm in length x 0.6 cm in diameter           Other findings -the fallopian tube is fimbriated with a pink-purple, smooth, and glistening serosa.  There are multiple paratubal cysts ranging from 0.4 to 0.5 cm in greatest dimension.  The lumen is pinpoint and patent. Other comments: None grossly appreciated.  Block summary: 1 -representative anterior cervix/endocervix 2 - representative posterior cervix/endocervix 3 - anterior lower uterine segment, submitted entirely 4 - 7 - posterior lower uterine segment, submitted entirely 8  - 13 - anterior endometrium, submitted entirely and sequentially from lower uterine segment to fundus      9, 12 - full-thickness endomyometrium 14 - 21 - posterior endometrium, submitted entirely and sequentially from lower uterine segment to fundus                               15, 18 - full-thickness endomyometrium 22 - representative intramural nodule, serosal adhesions, and slightly trabeculated myometrium 23 - 24 - right fallopian tube fimbria, longitudinally sectioned and submitted entirely 25 - 27 - right fallopian tube cross-sections, submitted entirely and sequentially from fimbria to uterine body 28 - 29 - left fallopian tube fimbria, longitudinally sectioned and submitted entirely 30 - 33 - left fallopian tube cross-sections, submitted entirely and sequentially from fimbria to uterine body  RB 09/28/2020   Final Diagnosis performed by Betsy Pries, MD.   Electronically signed 09/29/2020 10:15:09AM The electronic signature indicates that the named Attending Pathologist has evaluated the specimen Technical component performed at [facility], [address], [place], [place] [address] Lab: [phone] Dir: Rush Farmer, MD, MMM  Professional component performed at [facility], [facility], [place], [place], [place] [address] Lab: [phone] Dir: Dellia Nims. Rubinas, MD   . WBC 09/29/2020 15.0* 4.0 - 10.5 K/uL Final  . RBC 09/29/2020 3.86* 3.87 - 5.11 MIL/uL Final  . Hemoglobin 09/29/2020 11.9* 12.0 - 15.0 g/dL Final  . HCT 09/29/2020 33.9* 36.0 - 46.0 % Final  . MCV 09/29/2020 87.8  80.0 - 100.0 fL Final  . MCH 09/29/2020 30.8  26.0 - 34.0 pg Final  . MCHC 09/29/2020 35.1  30.0 - 36.0 g/dL Final  . RDW 09/29/2020 12.0  11.5 - 15.5 % Final  . Platelets 09/29/2020 370  150 - 400 K/uL Final  . nRBC 09/29/2020 0.0  0.0 - 0.2 % Final   Performed at [facility], [address] [place],  [address]  . Sodium  09/29/2020 139  135 - 145 mmol/L Final  . Potassium 09/29/2020 4.3  3.5 - 5.1 mmol/L Final  . Chloride 09/29/2020 108  98 - 111 mmol/L Final  . CO2 09/29/2020 23  22 - 32 mmol/L Final  . Glucose, Bld 09/29/2020 124* 70 - 99 mg/dL Final   Glucose reference range applies only to samples taken after fasting for at least 8 hours.  . BUN 09/29/2020 6  6 - 20 mg/dL Final  . Creatinine, Ser 09/29/2020 0.55  0.44 - 1.00 mg/dL  Final  . Calcium 09/29/2020 8.8* 8.9 - 10.3 mg/dL Final  . GFR, Estimated 09/29/2020 >60  >60 mL/min Final   Comment: (NOTE) Calculated using the CKD-EPI Creatinine Equation (2021)   . Anion gap 09/29/2020 8  5 - 15 Final   Performed at [facility], [address] [place], [place] [address]  . Sodium 09/28/2020 137  135 - 145 mmol/L Final  . Potassium 09/28/2020 4.1  3.5 - 5.1 mmol/L Final  . Chloride 09/28/2020 104  98 - 111 mmol/L Final  . CO2 09/28/2020 20* 22 - 32 mmol/L Final  . Anion gap 09/28/2020 13  5 - 15 Final   Performed at [facility], [address] [place], [place] [address]  . Magnesium 09/28/2020 2.1  1.7 - 2.4 mg/dL Final   Performed at [facility], [address] [place], [place] [address]  . WBC 09/28/2020 15.5* 4.0 - 10.5 K/uL Final  . RBC 09/28/2020 4.36  3.87 - 5.11 MIL/uL Final  . Hemoglobin 09/28/2020 13.6  12.0 - 15.0 g/dL Final  . HCT 23/76/2831 39.1  36.0 - 46.0 % Final  . MCV 09/28/2020 89.7  80.0 - 100.0 fL Final  . MCH 09/28/2020 31.2  26.0 - 34.0 pg Final  . MCHC 09/28/2020 34.8  30.0 - 36.0 g/dL Final  . RDW 51/76/1607 11.9  11.5 - 15.5 % Final  . Platelets 09/28/2020 330  150 - 400 K/uL Final  . nRBC 09/28/2020 0.0  0.0 - 0.2 % Final   Performed at [facility], [address], [place], [place] [address]  . Troponin I (High Sensitivity) 09/28/2020 <2  <18 ng/L Final   Comment: (NOTE) Elevated high sensitivity troponin I (hsTnI) values and significant  changes across serial measurements  may suggest ACS but many other  chronic and acute conditions are known to elevate hsTnI results.  Refer to the "Links" section for chest pain algorithms and additional  guidance. Performed at [facility], [address] [place], [place] [address]   . D-Dimer, Quant 09/28/2020 0.39  0.00 - 0.50 ug/mL-FEU Final   Comment: (NOTE) At the manufacturer cut-off value of 0.5 g/mL FEU, this assay has a negative predictive value of 95-100%.This assay is intended for use in conjunction with a clinical pretest probability (PTP) assessment model to exclude pulmonary embolism (PE) and deep venous thrombosis (DVT) in outpatients suspected of PE or DVT. Results should be correlated with clinical presentation. Performed at [facility] [address], [place], [place] [address]   . Magnesium 09/28/2020 2.0  1.7 - 2.4 mg/dL Final   Performed at [facility], [address] [place], [place] [address]  . Troponin I (High Sensitivity) 09/28/2020 <2  <18 ng/L Final   Comment: (NOTE) Elevated high sensitivity troponin I (hsTnI) values and significant  changes across serial measurements may suggest ACS but many other  chronic and acute conditions are known to elevate hsTnI results.  Refer to the "Links" section for chest pain algorithms and additional  guidance. Performed at [facility], [address] [place], [place] [address]   . TSH 09/28/2020 0.470  0.350 - 4.500 uIU/mL Final   Comment: Performed by a 3rd Generation assay with a functional sensitivity of <=0.01 uIU/mL. Performed at [facility], [address] [place], [place] [address]   . Cholesterol 09/29/2020 286* 0 - 200 mg/dL Final  . Triglycerides 09/29/2020 191* <150 mg/dL Final  . HDL 16/96/7893 55  >40 mg/dL  Final  . Total CHOL/HDL Ratio 09/29/2020 5.2  RATIO Final  . VLDL 09/29/2020 38  0 - 40 mg/dL Final  . LDL Cholesterol 09/29/2020 193* 0 - 99 mg/dL Final   Comment:         Total Cholesterol/HDL:CHD Risk Coronary Heart Disease Risk Table                     Men   Women  1/2 Average Risk   3.4   3.3  Average Risk       5.0   4.4  2 X Average Risk   9.6   7.1  3 X Average Risk  23.4   11.0        Use the calculated Patient Ratio above and the CHD Risk Table to determine the patient's CHD Risk.        ATP III CLASSIFICATION (LDL):  <100     mg/dL   Optimal  100-129  mg/dL   Near or Above                    Optimal  130-159  mg/dL   Borderline  160-189  mg/dL   High  >190     mg/dL   Very High Performed at [facility], [address] [place], [place] [address]   . Hemoglobin 09/28/2020 13.4  12.0 - 15.0 g/dL Final  . HCT 09/28/2020 38.3  36.0 - 46.0 % Final   Performed at [facility], [address] [place], [place] [address]  . Hemoglobin 09/29/2020 11.6* 12.0 - 15.0 g/dL Final  . HCT 09/29/2020 33.3* 36.0 - 46.0 % Final   Performed at [facility], [address] [place], [place] [address]  . Weight 09/29/2020 2,400  oz Final  . Height 09/29/2020 60  in Final  . BP 09/29/2020 132/86  mmHg Final  . Ao pk vel 09/29/2020 1.12  m/s Final  . AV Area VTI 09/29/2020 2.63  cm2 Final  . AR max vel 09/29/2020 2.40  cm2 Final  . AV Mean grad 09/29/2020 3.0  mmHg Final  . AV Peak grad 09/29/2020 5.0  mmHg Final  . S' Lateral 09/29/2020 2.34  cm Final  . AV Area mean vel 09/29/2020 2.26  cm2 Final  . Area-P 1/2 09/29/2020 4.49  cm2 Final    Assessment: 40 y.o. s/p TLH, BS, cystoscopy stable  Plan: Patient has done well after surgery with no apparent complications.  I have discussed the post-operative course to date, and the expected progress moving forward.  The patient understands what complications to be concerned about.  I will see the patient in routine follow up, or sooner if needed.    Activity plan: No heavy lifting, no intercourse, no baths  Undergoing outpatient Holter monitor.  Her palpitations noted in the  immediate postop period have resolved.  Return in about 5 weeks (around 11/09/2020) for postop.    [name], MD, [facility], [place]

## 2020-10-20 NOTE — Addendum Note (Signed)
Encounter addended by: Jeannette How on: 10/20/2020 12:22 PM  Actions taken: Imaging Exam ended

## 2020-10-27 ENCOUNTER — Telehealth: Payer: Self-pay

## 2020-10-27 NOTE — Telephone Encounter (Signed)
Unableble to reach pt regarding her recent Zio results, LDM on VM (DPR approved). Marrianne Mood D, PA-C had a chance to review her results and advised   Monitor placed after anesthesia resulted in prolonged Qtc and PACs and showed  --Normal heart rhythm  --Maximum rate 143bpm and average HR 91bpm  --Rare extra beats from the top part of the heart (PACs), sometimes three times in a row  --Rare extra beats from the bottom part of the heart   Very reassuring monitor without significant arrhythmias.   Report also uploaded to [facility] for review, advised to call back with any further questions.

## 2020-11-04 ENCOUNTER — Telehealth: Payer: Self-pay

## 2020-11-04 NOTE — Telephone Encounter (Signed)
FMLA/DISABILITY forms (2) for [employer] filled out, signature obtained and given to [place] for processing.

## 2020-11-09 ENCOUNTER — Ambulatory Visit (INDEPENDENT_AMBULATORY_CARE_PROVIDER_SITE_OTHER): Payer: No Typology Code available for payment source | Admitting: Obstetrics and Gynecology

## 2020-11-09 ENCOUNTER — Encounter: Payer: Self-pay | Admitting: Obstetrics and Gynecology

## 2020-11-09 ENCOUNTER — Other Ambulatory Visit: Payer: Self-pay

## 2020-11-09 VITALS — BP 122/78 | Wt 150.0 lb

## 2020-11-09 DIAGNOSIS — Z4889 Encounter for other specified surgical aftercare: Secondary | ICD-10-CM

## 2020-11-09 NOTE — Progress Notes (Signed)
Postoperative Follow-up Patient presents post op from [place], BS, cystoscopy 6weeks ago for lynch syndrome.  Subjective: Patient reports marked improvement in her preop symptoms. Eating a regular diet without difficulty. The patient is not having any pain.  Activity: normal activities of daily living.  Objective: Blood pressure 122/78, weight 150 lb (68 kg), last menstrual period 09/17/2020.  General: NAD Pulmonary: no increased work of breathing Abdomen: soft, non-tender, non-distended, incision(s) D/C/I GU: normal external female genitalia vaginal cuff intact, well healed.  Still some minor granulation tissue central cuff Extremities: no edema Neurologic: normal gait    Admission on 09/28/2020, Discharged on 09/29/2020  Component Date Value Ref Range Status  . ABO/RH(D) 09/28/2020    Final                   Value:O POS Performed at [facility], [address] [place], [place] [address]   . Preg Test, Ur 09/28/2020 NEGATIVE  NEGATIVE Final   Comment:        THE SENSITIVITY OF THIS METHODOLOGY IS >24 mIU/mL   . SURGICAL PATHOLOGY 09/28/2020    Final-Edited                   Value:SURGICAL PATHOLOGY CASE: [id number] PATIENT: Rhonda Shaw Surgical Pathology Report     Specimen Submitted: A. 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 UTERUS AND CERVIX WITH BILATERAL FALLOPIAN TUBES; TOTAL HYSTERECTOMY WITH BILATERAL SALPINGECTOMY: - CERVIX:      - NEGATIVE FOR DYSPLASIA AND MALIGNANCY. - ENDOMETRIUM:      - PROLIFERATIVE.  NEGATIVE FOR ATYPIA / EIN AND MALIGNANCY. - MYOMETRIUM:      - LEIOMYOMA. - BILATERAL FALLOPIAN TUBES:      - NO SIGNIFICANT PATHOLOGIC ALTERATION.  GROSS DESCRIPTION: A. Labeled: Uterus, cervix, bilateral fallopian tubes Received: Formalin Collection time: 10:32 AM on 09/28/2020 Placed into formalin time: 10:47 AM on 09/28/2020 Weight: 101  grams Dimensions:      Fundus -9.1 (superior to inferior) x 6.4 (breath of uterus at fundus) x 3.4 (anterior to posterior) cm      Cervix -5.9 x 4.7 cm Serosa: The serosa is tan-pink, smooth, and gli                         stening with scattered fine areas of adhesions on the posterior body. Cervix: The cervix is white-pink, smooth, and pearly. Endocervix: The endocervix is tan, mucoid, and finely granular with a 1.1 cm slit-like endocervical os.  The adjacent areas of cautery are inked as follows: Anterior = blue and posterior = black. Endometrial cavity:      Dimensions -4.4 (superior to inferior) x 2.9 (cornu to cornu) cm      Thickness -ranges from 0.1-0.2 cm      Other findings -the endometrium is tan-red and finely granular.  No distinct lesions are identified. Myometrium:     Thickness -1.8 cm     Other findings -the myometrium is tan-pink and soft with a single intramural nodule, 0.3 x 0.3 x 0.2 cm.  The nodule is well-circumscribed with a tan-white and firm cut surface.  There are also scattered areas of slight trabeculation on the posterior body. Adnexa:           Measurements -5.6 cm in length x 0.7 cm in diameter  Other findings -the fallopian tube is fimbriated wi                         th a pink-purple, smooth, and glistening serosa.  There is a single 0.4 x 0.4 x 0.3 cm paratubal cyst.  The lumen is pinpoint and patent.      Left fallopian tube            Measurements -7.2 cm in length x 0.6 cm in diameter           Other findings -the fallopian tube is fimbriated with a pink-purple, smooth, and glistening serosa.  There are multiple paratubal cysts ranging from 0.4 to 0.5 cm in greatest dimension.  The lumen is pinpoint and patent. Other comments: None grossly appreciated.  Block summary: 1 -representative anterior cervix/endocervix 2 - representative posterior cervix/endocervix 3 - anterior lower uterine segment, submitted entirely 4 - 7 -  posterior lower uterine segment, submitted entirely 8 - 13 - anterior endometrium, submitted entirely and sequentially from lower uterine segment to fundus      9, 12 - full-thickness endomyometrium 14 - 21 - posterior endometrium, submitted entirely and sequentially from lower uterine segment to fundus                               15, 18 - full-thickness endomyometrium 22 - representative intramural nodule, serosal adhesions, and slightly trabeculated myometrium 23 - 24 - right fallopian tube fimbria, longitudinally sectioned and submitted entirely 25 - 27 - right fallopian tube cross-sections, submitted entirely and sequentially from fimbria to uterine body 28 - 29 - left fallopian tube fimbria, longitudinally sectioned and submitted entirely 30 - 33 - left fallopian tube cross-sections, submitted entirely and sequentially from fimbria to uterine body  RB 09/28/2020   Final Diagnosis performed by Betsy Pries, MD.   Electronically signed 09/29/2020 10:15:09AM The electronic signature indicates that the named Attending Pathologist has evaluated the specimen Technical component performed at [facility], [address], [place], [place] [address] Lab: [id number] Dir: Rush Farmer, MD, MMM  Professional component performed at [facility], [facility], [place], [place], [place] [address] Lab: [phone] Dir: Dellia Nims. Rubinas, MD   . WBC 09/29/2020 15.0* 4.0 - 10.5 K/uL Final  . RBC 09/29/2020 3.86* 3.87 - 5.11 MIL/uL Final  . Hemoglobin 09/29/2020 11.9* 12.0 - 15.0 g/dL Final  . HCT 09/29/2020 33.9* 36.0 - 46.0 % Final  . MCV 09/29/2020 87.8  80.0 - 100.0 fL Final  . MCH 09/29/2020 30.8  26.0 - 34.0 pg Final  . MCHC 09/29/2020 35.1  30.0 - 36.0 g/dL Final  . RDW 09/29/2020 12.0  11.5 - 15.5 % Final  . Platelets 09/29/2020 370  150 - 400 K/uL Final  . nRBC 09/29/2020 0.0  0.0 - 0.2 % Final   Performed at [facility], [address] [place], [place] [address]  . Sodium 09/29/2020 139  135 - 145 mmol/L Final  . Potassium 09/29/2020 4.3  3.5 - 5.1 mmol/L Final  . Chloride 09/29/2020 108  98 - 111 mmol/L Final  . CO2 09/29/2020 23  22 - 32 mmol/L Final  . Glucose, Bld 09/29/2020 124* 70 - 99 mg/dL Final   Glucose reference range applies only  to samples taken after fasting for at least 8 hours.  . BUN 09/29/2020 6  6 - 20 mg/dL Final  . Creatinine, Ser 09/29/2020 0.55  0.44 - 1.00 mg/dL Final  . Calcium 09/29/2020 8.8* 8.9 - 10.3 mg/dL Final  . GFR, Estimated 09/29/2020 >60  >60 mL/min Final   Comment: (NOTE) Calculated using the CKD-EPI Creatinine Equation (2021)   . Anion gap 09/29/2020 8  5 - 15 Final   Performed at [facility], [address] [place], [place] [address]  . Sodium 09/28/2020 137  135 - 145 mmol/L Final  . Potassium 09/28/2020 4.1  3.5 - 5.1 mmol/L Final  . Chloride 09/28/2020 104  98 - 111 mmol/L Final  . CO2 09/28/2020 20* 22 - 32 mmol/L Final  . Anion gap 09/28/2020 13  5 - 15 Final   Performed at [facility], [address] [place] [address], [place], [place] [address]  . Magnesium 09/28/2020 2.1  1.7 - 2.4 mg/dL Final   Performed at [facility], [address] [place], [place] [address]  . WBC 09/28/2020 15.5* 4.0 - 10.5 K/uL Final  . RBC 09/28/2020 4.36  3.87 - 5.11 MIL/uL Final  . Hemoglobin 09/28/2020 13.6  12.0 - 15.0 g/dL Final  . HCT 09/28/2020 39.1  36.0 - 46.0 % Final  . MCV 09/28/2020 89.7  80.0 - 100.0 fL Final  . MCH 09/28/2020 31.2  26.0 - 34.0 pg Final  . MCHC 09/28/2020 34.8  30.0 - 36.0 g/dL Final  . RDW 09/28/2020 11.9  11.5 - 15.5 % Final  . Platelets 09/28/2020 330  150 - 400 K/uL Final  . nRBC 09/28/2020 0.0  0.0 - 0.2 % Final   Performed at [facility], [address] [place], [place] [address]  . Troponin I (High Sensitivity) 09/28/2020 <2  <18 ng/L Final   Comment: (NOTE) Elevated high sensitivity troponin I (hsTnI) values and  significant  changes across serial measurements may suggest ACS but many other  chronic and acute conditions are known to elevate hsTnI results.  Refer to the "Links" section for chest pain algorithms and additional  guidance. Performed at [facility], [address], [place], [place] [address]   . D-Dimer, Quant 09/28/2020 0.39  0.00 - 0.50 ug/mL-FEU Final   Comment: (NOTE) At the manufacturer cut-off value of 0.5 g/mL FEU, this assay has a negative predictive value of 95-100%.This assay is intended for use in conjunction with a clinical pretest probability (PTP) assessment model to exclude pulmonary embolism (PE) and deep venous thrombosis (DVT) in outpatients suspected of PE or DVT. Results should be correlated with clinical presentation. Performed at [facility], [address] [place], [place] [address]   . Magnesium 09/28/2020 2.0  1.7 - 2.4 mg/dL Final   Performed at [facility], [address] [place], [place] [address]  . Troponin I (High Sensitivity) 09/28/2020 <2  <18 ng/L Final   Comment: (NOTE) Elevated high sensitivity troponin I (hsTnI) values and significant  changes across serial measurements may suggest ACS but many other  chronic and acute conditions are known to elevate hsTnI results.  Refer to the "Links" section for chest pain algorithms and additional  guidance. Performed at [facility], [address], [place], [place] [address]   . TSH 09/28/2020 0.470  0.350 - 4.500 uIU/mL Final   Comment: Performed by a 3rd Generation assay with a functional sensitivity of <=0.01 uIU/mL. Performed at [facility], [place]  [address] [place], [place] [address]   . Cholesterol 09/29/2020 286* 0 - 200 mg/dL Final  . Triglycerides 09/29/2020 191* <150 mg/dL Final  . HDL 09/29/2020 55  >40 mg/dL Final  . Total CHOL/HDL Ratio 09/29/2020 5.2  RATIO Final  . VLDL 09/29/2020 38  0 - 40 mg/dL Final  . LDL Cholesterol  09/29/2020 193* 0 - 99 mg/dL Final   Comment:        Total Cholesterol/HDL:CHD Risk Coronary Heart Disease Risk Table                     Men   Women  1/2 Average Risk   3.4   3.3  Average Risk       5.0   4.4  2 X Average Risk   9.6   7.1  3 X Average Risk  23.4   11.0        Use the calculated Patient Ratio above and the CHD Risk Table to determine the patient's CHD Risk.        ATP III CLASSIFICATION (LDL):  <100     mg/dL   Optimal  100-129  mg/dL   Near or Above                    Optimal  130-159  mg/dL   Borderline  160-189  mg/dL   High  >190     mg/dL   Very High Performed at [facility], [address], [place], [place] [address]   . Hemoglobin 09/28/2020 13.4  12.0 - 15.0 g/dL Final  . HCT 09/28/2020 38.3  36.0 - 46.0 % Final   Performed at [facility], [address] [place], [place] [address]  . Hemoglobin 09/29/2020 11.6* 12.0 - 15.0 g/dL Final  . HCT 09/29/2020 33.3* 36.0 - 46.0 % Final   Performed at [facility], [address] [place], [place] [address]  . Weight 09/29/2020 2,400  oz Final  . Height 09/29/2020 60  in Final  . BP 09/29/2020 132/86  mmHg Final  . Ao pk vel 09/29/2020 1.12  m/s Final  . AV Area VTI 09/29/2020 2.63  cm2 Final  . AR max vel 09/29/2020 2.40  cm2 Final  . AV Mean grad 09/29/2020 3.0  mmHg Final  . AV Peak grad 09/29/2020 5.0  mmHg Final  . S' Lateral 09/29/2020 2.34  cm Final  . AV Area mean vel 09/29/2020 2.26  cm2 Final  . Area-P 1/2 09/29/2020 4.49  cm2 Final    Assessment: 40 y.o. s/p TLH, BS, cystoscopy stable  Plan: Patient has done well after surgery with no apparent complications.  I have discussed the post-operative course to date, and the expected progress moving forward.  The patient understands what complications to be concerned about.  I will see the patient in routine follow up, or sooner if needed.    Activity plan: No restriction.  No intercourse for additional 2  weeks.    [name], MD, [facility], [facility] 11/09/2020, 2:18 PM

## 2020-11-09 NOTE — Telephone Encounter (Signed)
Records faxed 11/09/20 c forms.

## 2020-12-10 NOTE — Telephone Encounter (Signed)
Attempted to schedule call dropped

## 2021-11-02 NOTE — Telephone Encounter (Signed)
Unable to reach to schedule multiple attempts. Closing encounter.

## 2022-09-30 IMAGING — MG MM DIGITAL DIAGNOSTIC UNILAT*R* W/ TOMO W/ CAD
6 series · 6 of 18 positions shown · non-contrast
Comparison: Previous exam(s).

CLINICAL DATA: 39-year-old female presenting as a recall from
baseline screening for possible right breast mass.

EXAM:
DIGITAL DIAGNOSTIC UNILATERAL RIGHT MAMMOGRAM WITH TOMOSYNTHESIS AND
CAD; ULTRASOUND RIGHT BREAST LIMITED
TECHNIQUE: Right digital diagnostic mammography and breast tomosynthesis was
performed. The images were evaluated with computer-aided detection.;
Targeted ultrasound examination of the right breast was performed

[R CC synth-2D (1 of 2)]
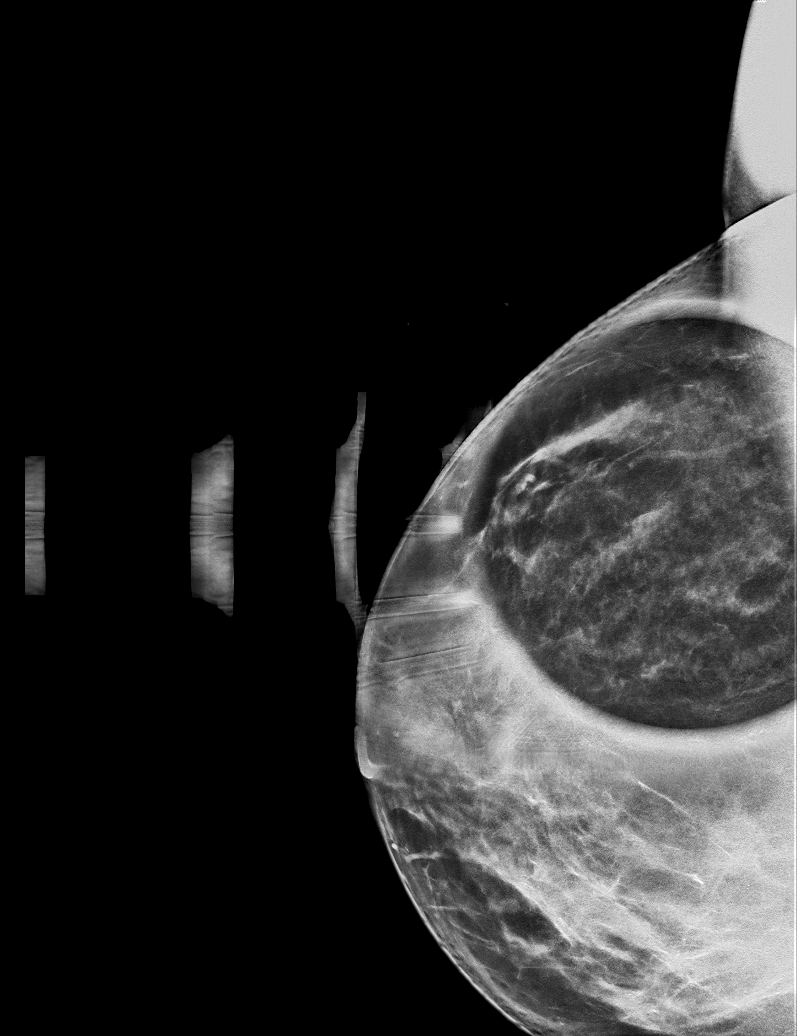

[R CC synth-2D (2 of 2)]
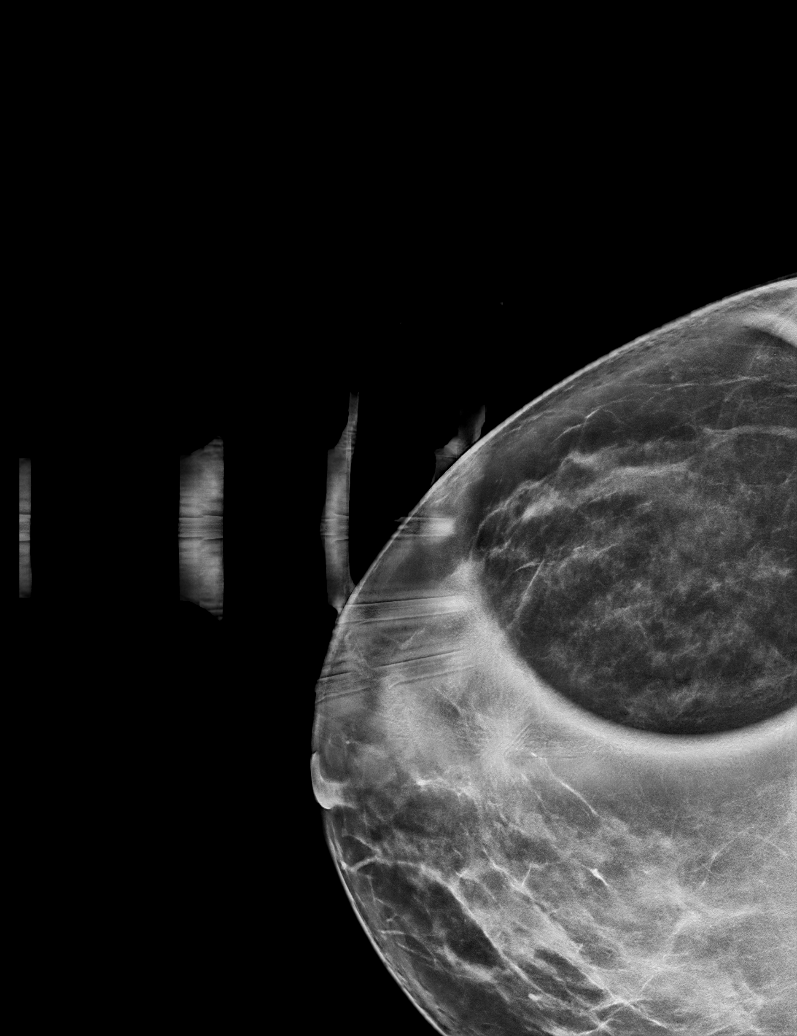

[R MLO synth-2D]
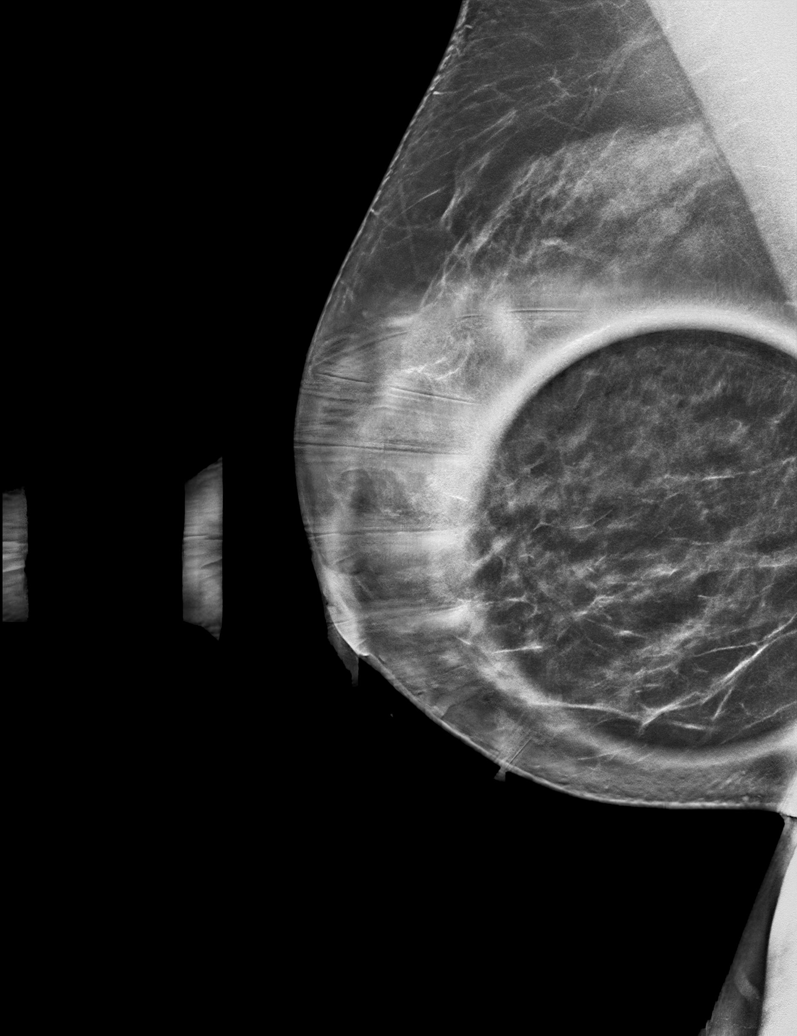

[R MLO tomo · tomo slice 39/77.0]
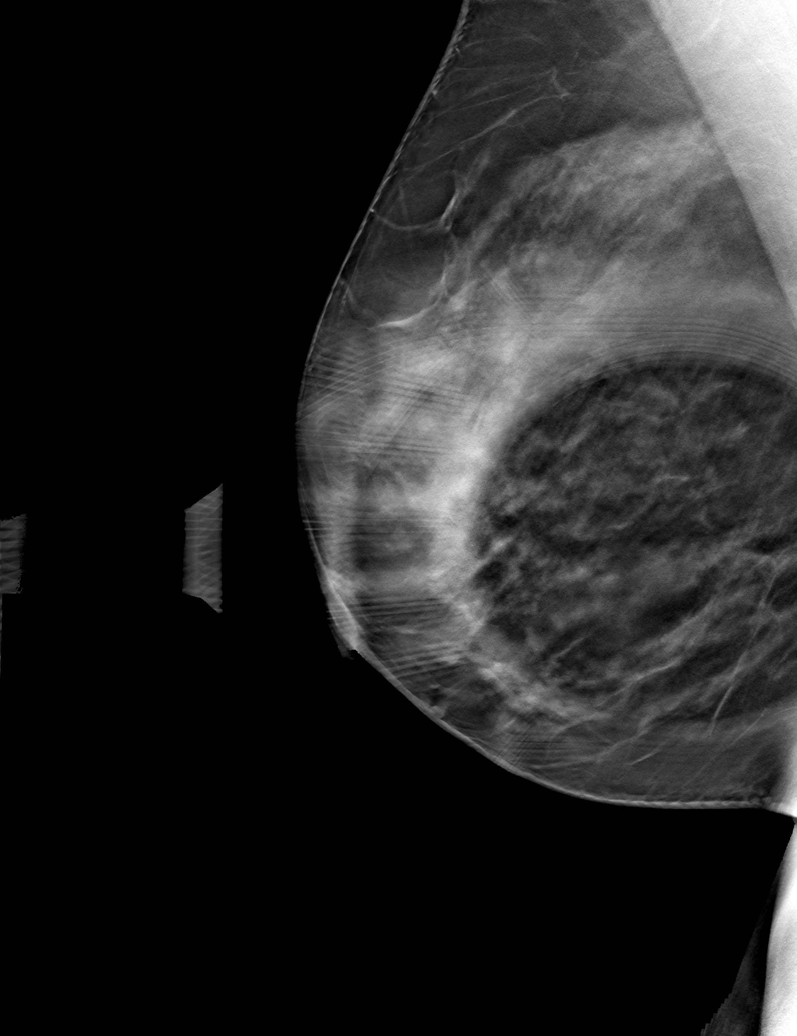

[R CC tomo (1 of 2) · tomo slice 34/67.0]
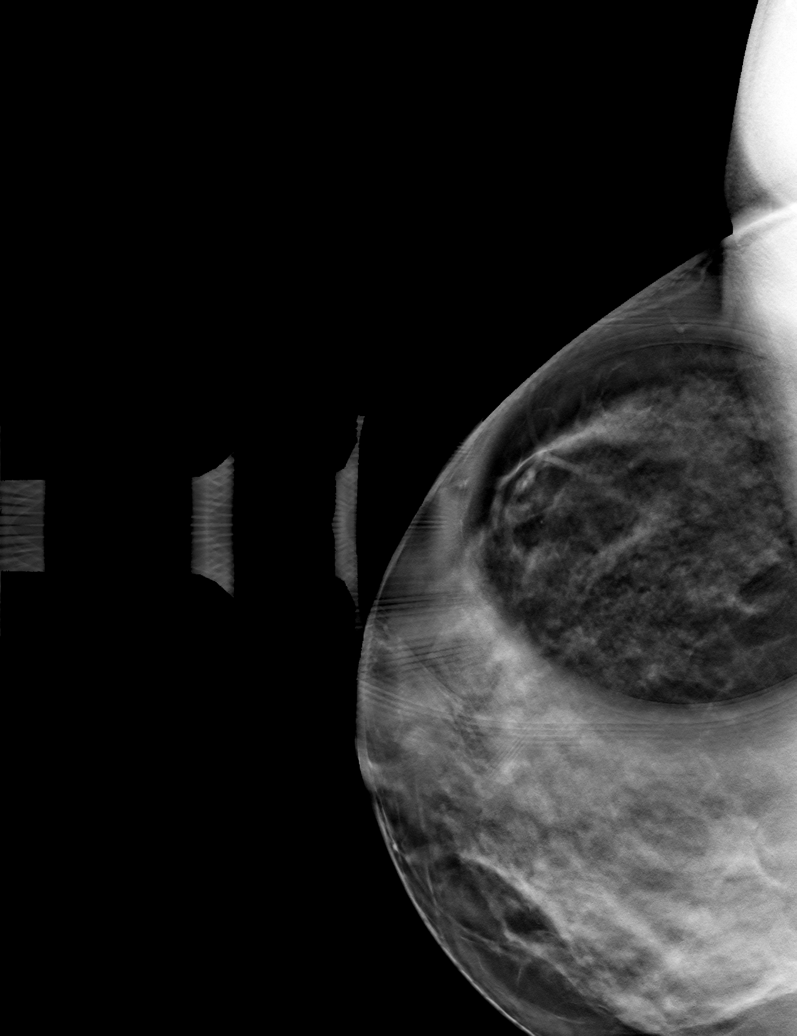

[R CC tomo (2 of 2) · tomo slice 39/77.0]
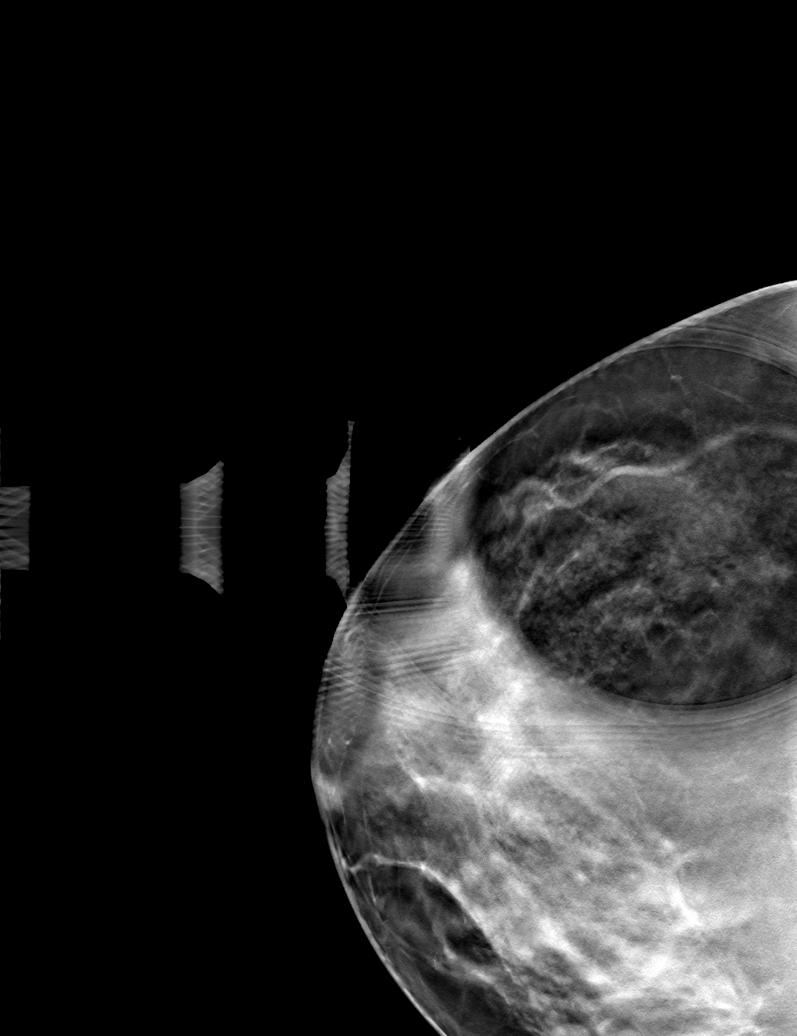

[6 of 18 positions shown; findings below may reference images not displayed]

ACR Breast Density Category c: The breast tissue is heterogeneously
dense, which may obscure small masses.
FINDINGS: Mammogram:

Spot compression tomosynthesis views of the right breast were
performed demonstrating persistence of an oval circumscribed mass in
the outer right breast posterior depth measuring approximately 5 mm,
best seen on the spot cc view.

Ultrasound:

Targeted ultrasound is performed in the right breast at 8:30 o'clock
8 cm from the nipple demonstrating an oval circumscribed anechoic
bilobed mass measuring 0.8 x 0.4 x 0.5 cm, consistent with a benign
simple cyst. No internal vascularity. This corresponds to the
mammographic finding.
IMPRESSION: Benign simple cyst in the right breast at 8:30 o'clock. No
mammographic or sonographic evidence of malignancy.

RECOMMENDATION:
Screening mammogram in one year.(Code:[id number])

I have discussed the findings and recommendations with the patient.
If applicable, a reminder letter will be sent to the patient
regarding the next appointment.

BI-RADS CATEGORY  2: Benign.
